# Patient Record
Sex: Female | Born: 1950
Health system: Southern US, Community
[De-identification: ages and names within clinical notes are randomized; demographics above are authoritative.]

## PROBLEM LIST (undated history)

## (undated) DIAGNOSIS — I739 Peripheral vascular disease, unspecified: Secondary | ICD-10-CM

## (undated) DIAGNOSIS — I6529 Occlusion and stenosis of unspecified carotid artery: Secondary | ICD-10-CM

## (undated) DIAGNOSIS — E785 Hyperlipidemia, unspecified: Secondary | ICD-10-CM

## (undated) DIAGNOSIS — F411 Generalized anxiety disorder: Secondary | ICD-10-CM

## (undated) DIAGNOSIS — F17200 Nicotine dependence, unspecified, uncomplicated: Secondary | ICD-10-CM

## (undated) DIAGNOSIS — G35 Multiple sclerosis: Secondary | ICD-10-CM

## (undated) HISTORY — DX: Generalized anxiety disorder: F41.1

## (undated) HISTORY — DX: Multiple sclerosis: G35

## (undated) HISTORY — DX: Hyperlipidemia, unspecified: E78.5

## (undated) HISTORY — DX: Peripheral vascular disease, unspecified: I73.9

## (undated) HISTORY — PX: OTHER SURGICAL HISTORY: SHX169

## (undated) HISTORY — DX: Occlusion and stenosis of unspecified carotid artery: I65.29

## (undated) HISTORY — DX: Nicotine dependence, unspecified, uncomplicated: F17.200

---

## 1990-04-06 HISTORY — PX: TUBAL LIGATION: SHX77

## 1999-01-13 ENCOUNTER — Encounter: Admission: RE | Admit: 1999-01-13 | Discharge: 1999-01-13 | Payer: Self-pay | Admitting: *Deleted

## 1999-02-10 ENCOUNTER — Other Ambulatory Visit: Admission: RE | Admit: 1999-02-10 | Discharge: 1999-02-10 | Payer: Self-pay | Admitting: *Deleted

## 2000-02-20 ENCOUNTER — Encounter: Admission: RE | Admit: 2000-02-20 | Discharge: 2000-02-20 | Payer: Self-pay | Admitting: *Deleted

## 2000-02-20 ENCOUNTER — Encounter: Payer: Self-pay | Admitting: *Deleted

## 2000-03-09 ENCOUNTER — Other Ambulatory Visit: Admission: RE | Admit: 2000-03-09 | Discharge: 2000-03-09 | Payer: Self-pay | Admitting: *Deleted

## 2001-02-23 ENCOUNTER — Encounter: Payer: Self-pay | Admitting: *Deleted

## 2001-02-23 ENCOUNTER — Encounter: Admission: RE | Admit: 2001-02-23 | Discharge: 2001-02-23 | Payer: Self-pay | Admitting: *Deleted

## 2001-07-06 ENCOUNTER — Other Ambulatory Visit: Admission: RE | Admit: 2001-07-06 | Discharge: 2001-07-06 | Payer: Self-pay | Admitting: *Deleted

## 2002-10-30 ENCOUNTER — Emergency Department (HOSPITAL_COMMUNITY): Admission: EM | Admit: 2002-10-30 | Discharge: 2002-10-30 | Payer: Self-pay | Admitting: Emergency Medicine

## 2003-07-27 ENCOUNTER — Encounter: Admission: RE | Admit: 2003-07-27 | Discharge: 2003-07-27 | Payer: Self-pay | Admitting: *Deleted

## 2005-03-20 ENCOUNTER — Encounter: Admission: RE | Admit: 2005-03-20 | Discharge: 2005-03-20 | Payer: Self-pay | Admitting: *Deleted

## 2006-02-10 ENCOUNTER — Other Ambulatory Visit: Admission: RE | Admit: 2006-02-10 | Discharge: 2006-02-10 | Payer: Self-pay | Admitting: *Deleted

## 2007-09-10 ENCOUNTER — Telehealth (INDEPENDENT_AMBULATORY_CARE_PROVIDER_SITE_OTHER): Payer: Self-pay | Admitting: *Deleted

## 2007-09-10 ENCOUNTER — Emergency Department (HOSPITAL_COMMUNITY): Admission: EM | Admit: 2007-09-10 | Discharge: 2007-09-10 | Payer: Self-pay | Admitting: Emergency Medicine

## 2007-09-13 ENCOUNTER — Ambulatory Visit: Payer: Self-pay | Admitting: Internal Medicine

## 2007-09-13 DIAGNOSIS — R55 Syncope and collapse: Secondary | ICD-10-CM | POA: Insufficient documentation

## 2007-09-13 DIAGNOSIS — F411 Generalized anxiety disorder: Secondary | ICD-10-CM

## 2007-09-13 DIAGNOSIS — G35 Multiple sclerosis: Secondary | ICD-10-CM

## 2007-09-13 DIAGNOSIS — G35D Multiple sclerosis, unspecified: Secondary | ICD-10-CM | POA: Insufficient documentation

## 2007-09-17 ENCOUNTER — Encounter: Payer: Self-pay | Admitting: Internal Medicine

## 2007-09-22 ENCOUNTER — Encounter: Payer: Self-pay | Admitting: Internal Medicine

## 2007-09-30 ENCOUNTER — Ambulatory Visit: Payer: Self-pay

## 2007-09-30 ENCOUNTER — Encounter: Payer: Self-pay | Admitting: Internal Medicine

## 2007-10-03 ENCOUNTER — Encounter: Payer: Self-pay | Admitting: Internal Medicine

## 2007-10-03 DIAGNOSIS — I739 Peripheral vascular disease, unspecified: Secondary | ICD-10-CM

## 2007-10-10 ENCOUNTER — Ambulatory Visit: Payer: Self-pay | Admitting: Internal Medicine

## 2007-10-11 ENCOUNTER — Ambulatory Visit: Payer: Self-pay | Admitting: Internal Medicine

## 2007-10-17 ENCOUNTER — Ambulatory Visit: Payer: Self-pay | Admitting: Surgery

## 2007-11-14 ENCOUNTER — Encounter: Payer: Self-pay | Admitting: Internal Medicine

## 2007-11-14 ENCOUNTER — Ambulatory Visit: Payer: Self-pay | Admitting: Surgery

## 2007-11-29 ENCOUNTER — Encounter: Admission: RE | Admit: 2007-11-29 | Discharge: 2007-11-29 | Payer: Self-pay | Admitting: Gynecology

## 2007-12-20 ENCOUNTER — Ambulatory Visit: Payer: Self-pay | Admitting: Internal Medicine

## 2007-12-20 LAB — CONVERTED CEMR LAB
ALT: 16 units/L (ref 0–35)
AST: 15 units/L (ref 0–37)

## 2008-01-30 ENCOUNTER — Ambulatory Visit: Payer: Self-pay | Admitting: Internal Medicine

## 2008-02-14 ENCOUNTER — Other Ambulatory Visit: Admission: RE | Admit: 2008-02-14 | Discharge: 2008-02-14 | Payer: Self-pay | Admitting: Gynecology

## 2008-05-14 ENCOUNTER — Encounter: Payer: Self-pay | Admitting: Internal Medicine

## 2008-05-14 ENCOUNTER — Ambulatory Visit: Payer: Self-pay | Admitting: Surgery

## 2008-09-05 ENCOUNTER — Telehealth: Payer: Self-pay | Admitting: Internal Medicine

## 2008-09-07 ENCOUNTER — Telehealth: Payer: Self-pay | Admitting: Internal Medicine

## 2008-12-14 ENCOUNTER — Ambulatory Visit: Payer: Self-pay | Admitting: Internal Medicine

## 2008-12-20 DIAGNOSIS — E785 Hyperlipidemia, unspecified: Secondary | ICD-10-CM

## 2008-12-20 DIAGNOSIS — Z8669 Personal history of other diseases of the nervous system and sense organs: Secondary | ICD-10-CM | POA: Insufficient documentation

## 2008-12-20 DIAGNOSIS — I2510 Atherosclerotic heart disease of native coronary artery without angina pectoris: Secondary | ICD-10-CM | POA: Insufficient documentation

## 2008-12-20 DIAGNOSIS — I6529 Occlusion and stenosis of unspecified carotid artery: Secondary | ICD-10-CM

## 2008-12-21 ENCOUNTER — Ambulatory Visit: Payer: Self-pay | Admitting: Internal Medicine

## 2008-12-21 DIAGNOSIS — F17200 Nicotine dependence, unspecified, uncomplicated: Secondary | ICD-10-CM

## 2009-01-10 ENCOUNTER — Encounter: Admission: RE | Admit: 2009-01-10 | Discharge: 2009-01-10 | Payer: Self-pay | Admitting: Gynecology

## 2009-01-16 ENCOUNTER — Telehealth: Payer: Self-pay | Admitting: Internal Medicine

## 2009-07-09 IMAGING — CR DG CHEST 2V
2 series · 2 of 2 positions shown · non-contrast
Comparison: None

CLINICAL DATA: Syncopal episode, smoking history, the patient has
multiple sclerosis

CHEST - 2 VIEW

[w chest pa]
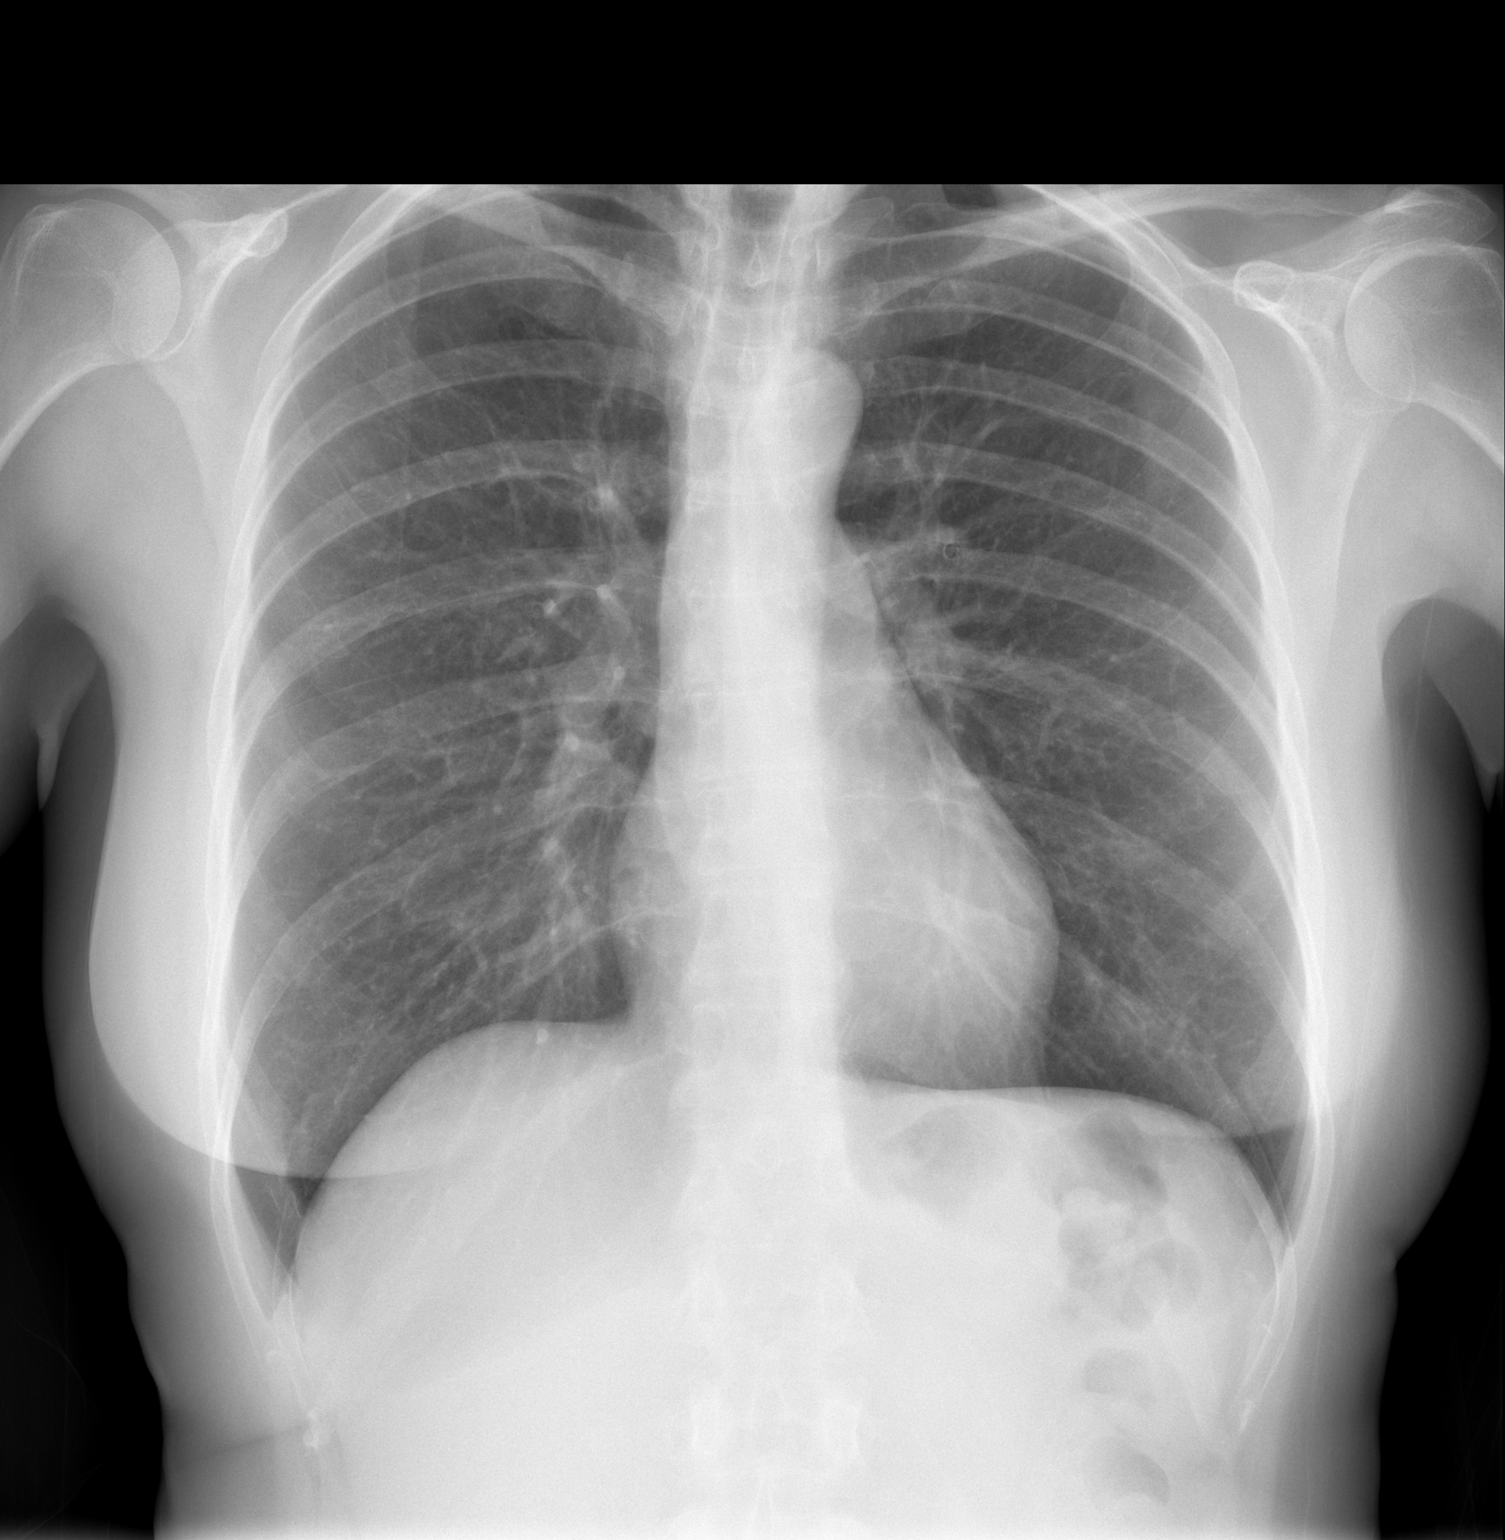

[w chest lat]
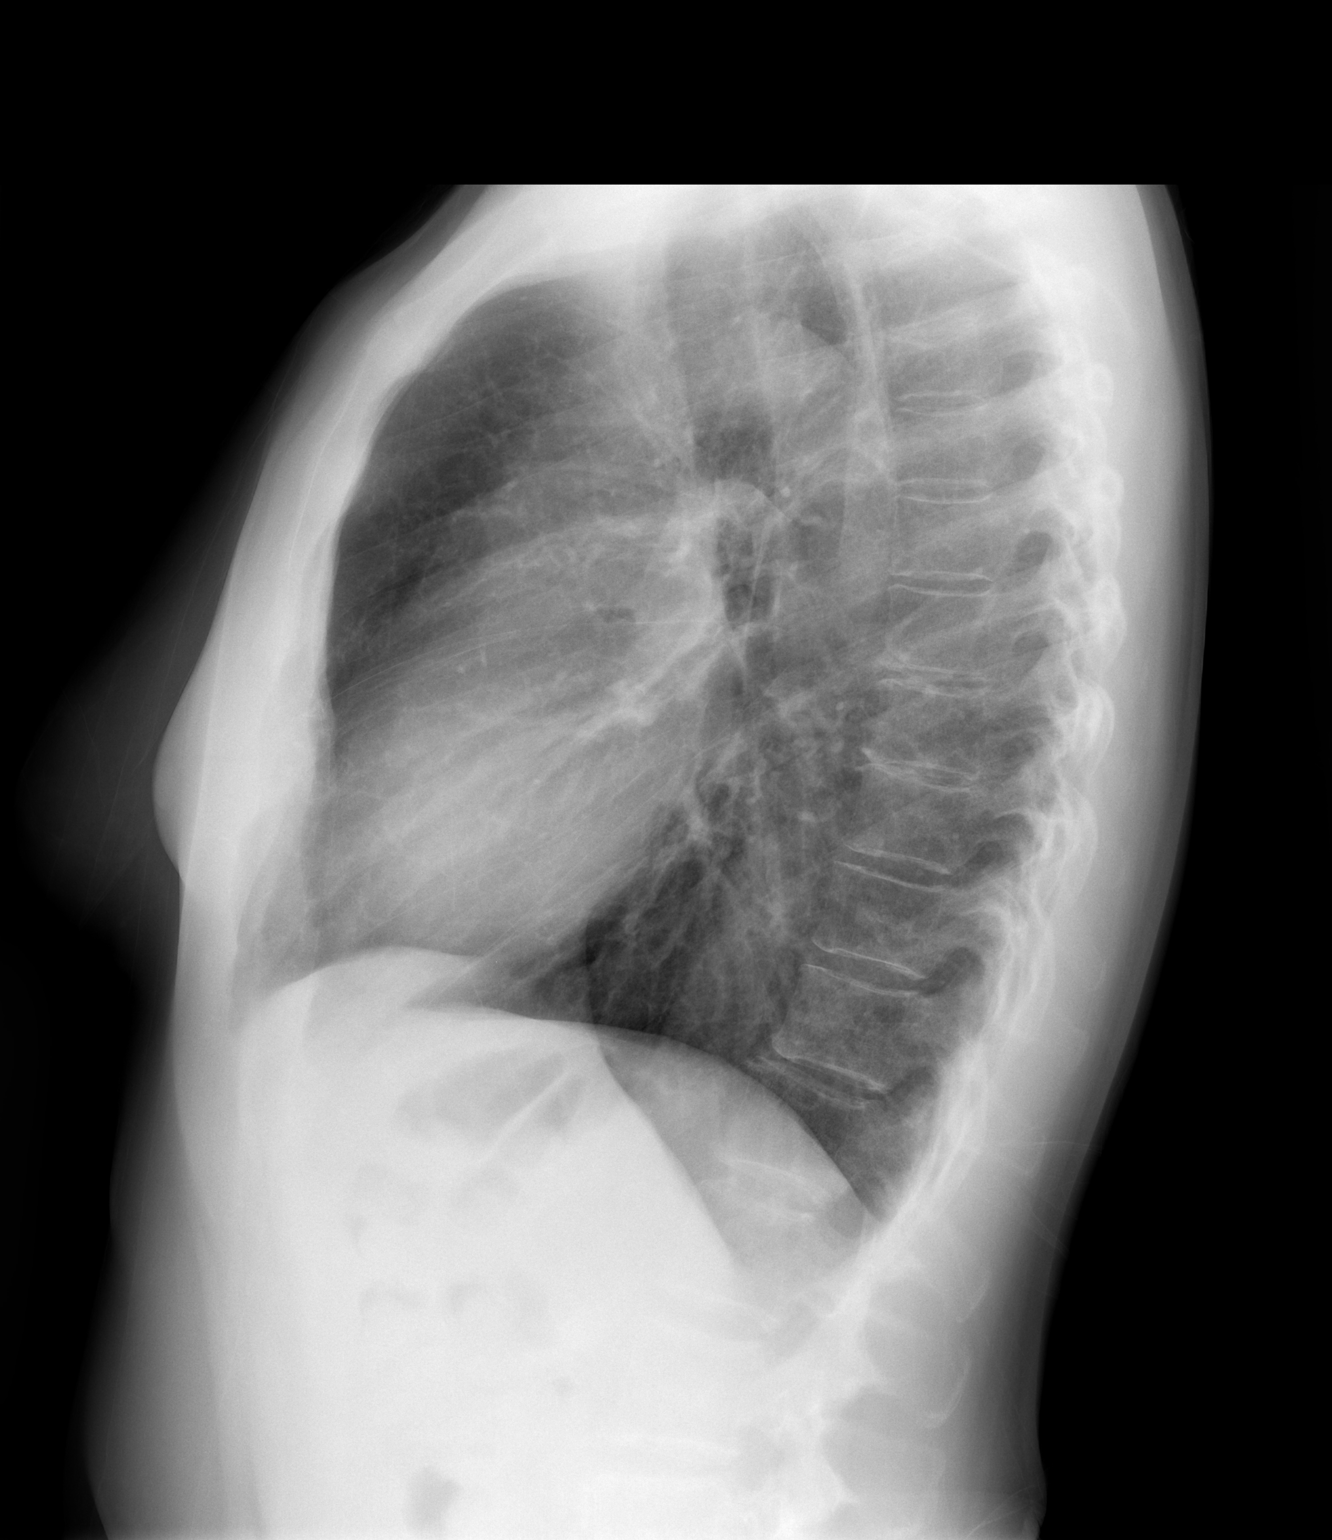

[2 of 2 positions shown; findings below may reference images not displayed]

FINDINGS: The lungs are clear and somewhat hyperaerated.  Heart is
within normal limits in size.  No bony abnormality is seen.
IMPRESSION: No active lung disease.  The lungs are hyperaerated.

## 2009-07-09 IMAGING — CT CT HEAD W/O CM
1 series · 16 of 30 positions shown, 20 images · non-contrast
Comparison: None

CLINICAL DATA: Multiple syncopal episodes of last few weeks

CT HEAD WITHOUT CONTRAST
TECHNIQUE: Contiguous axial images were obtained from the base of
the skull through the vertex without contrast.

[Series 2: head routine 4.8 h37s · axial · 0.43mm/px · z∈[+1203,+1332]mm · 16 of 30 slices shown, 20 images]
[im 2/30  brain]
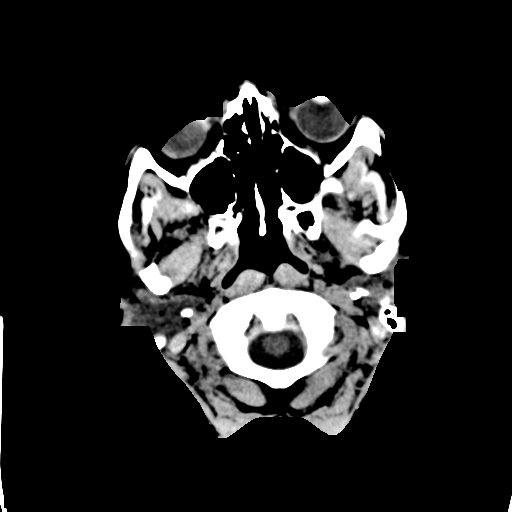
[im 2/30  bone]
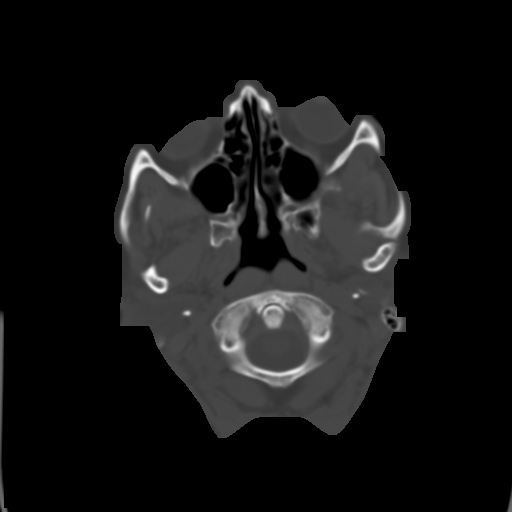
[im 4/30  brain]
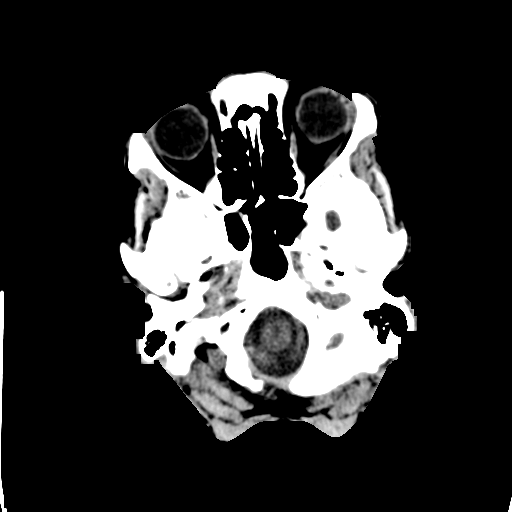
[im 6/30  brain]
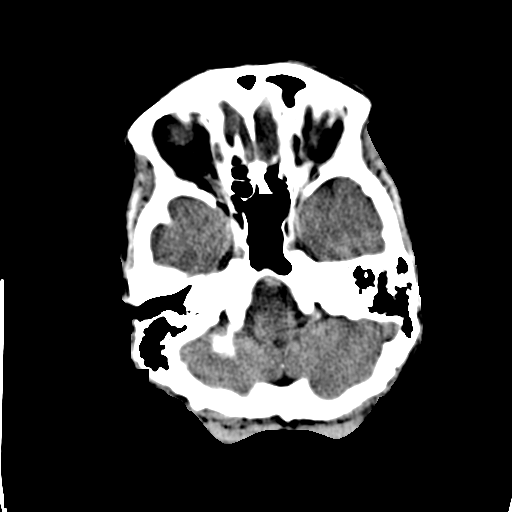
[im 8/30  brain]
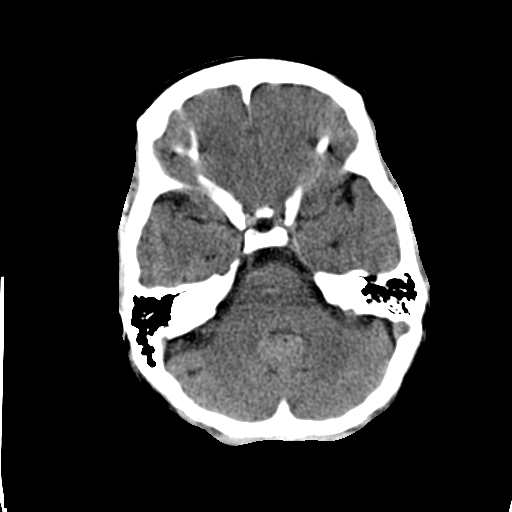
[im 9/30  brain]
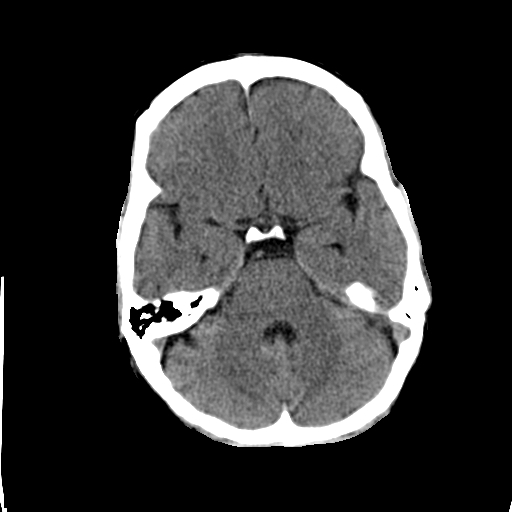
[im 9/30  bone]
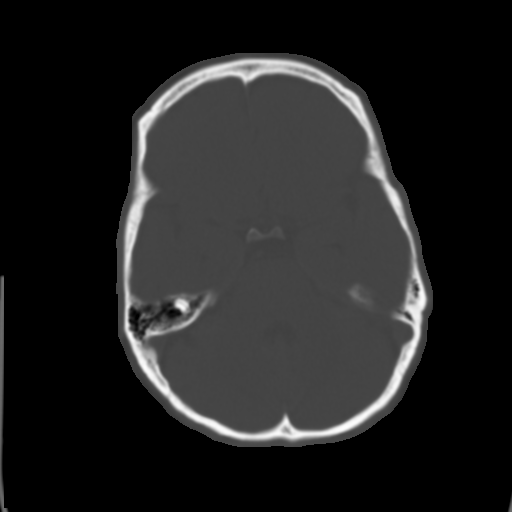
[im 11/30  brain]
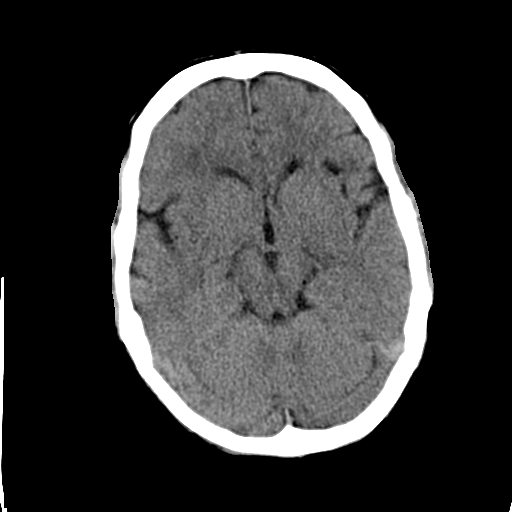
[im 13/30  brain]
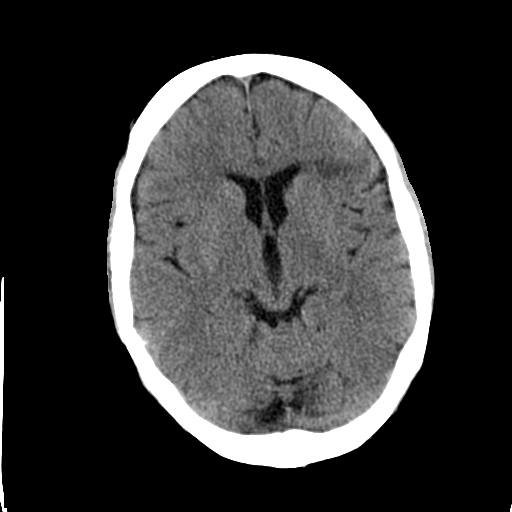
[im 15/30  brain]
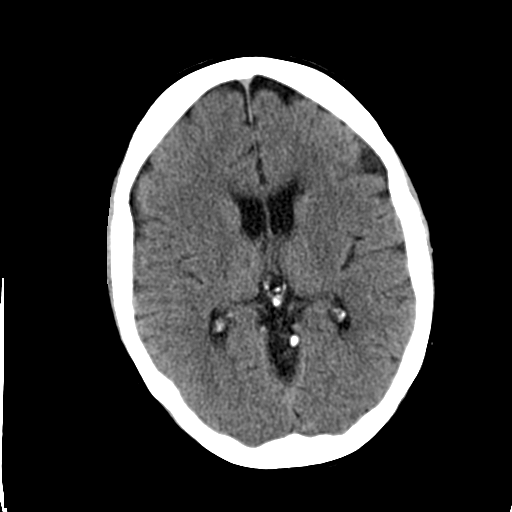
[im 16/30  brain]
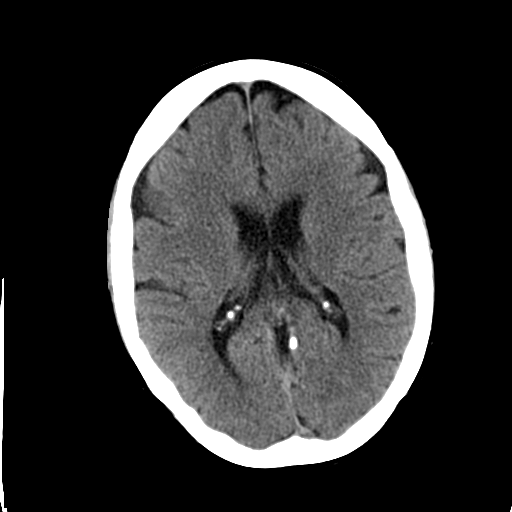
[im 16/30  bone]
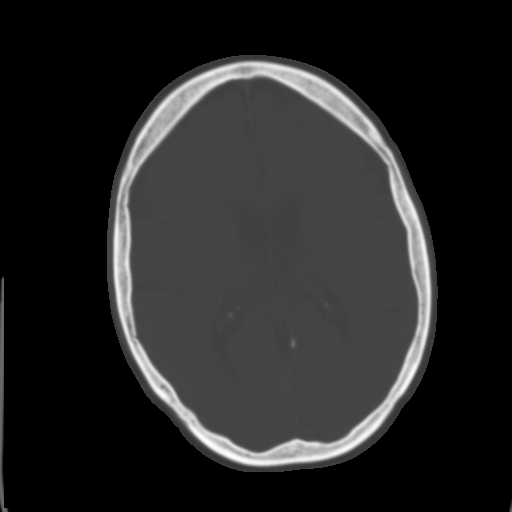
[im 18/30  brain]
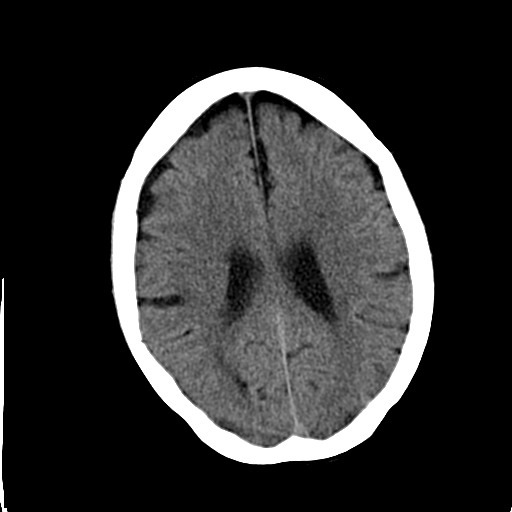
[im 20/30  brain]
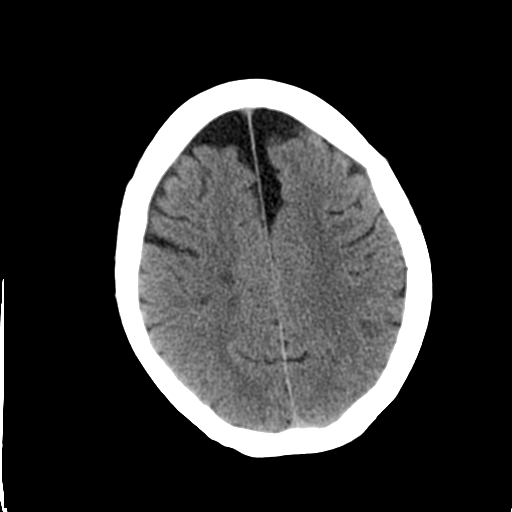
[im 22/30  brain]
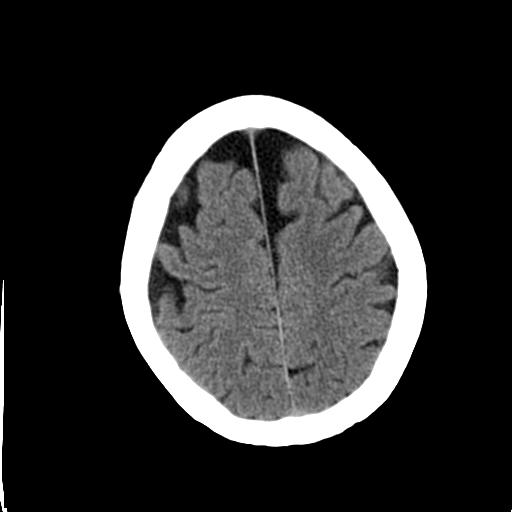
[im 23/30  brain]
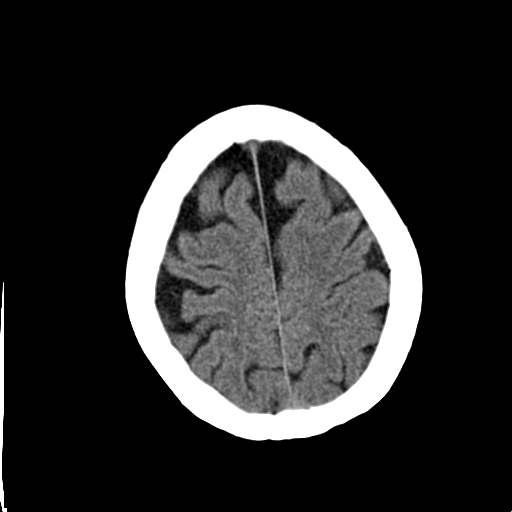
[im 23/30  bone]
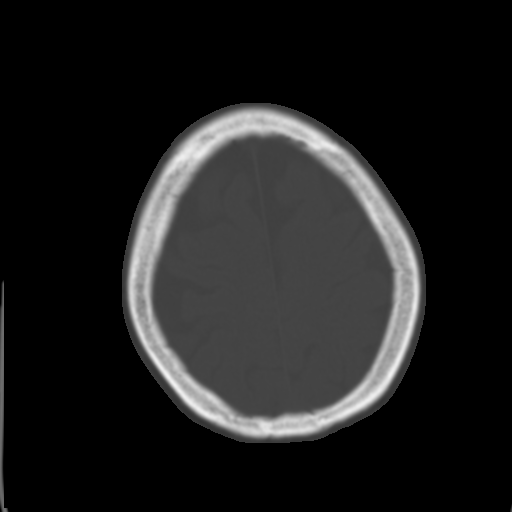
[im 25/30  brain]
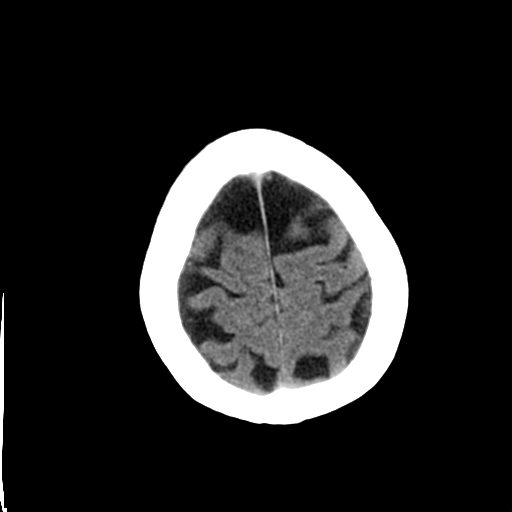
[im 27/30  brain]
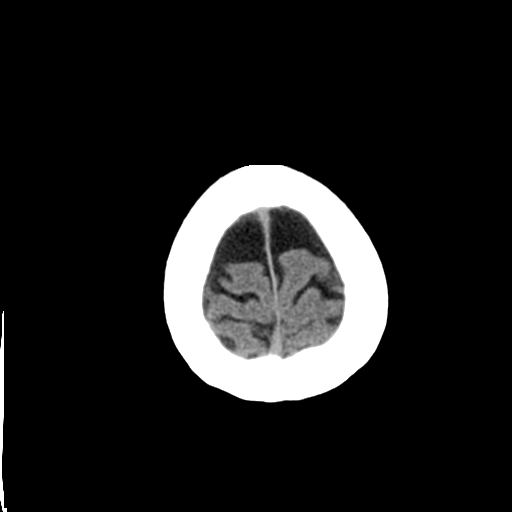
[im 29/30  brain]
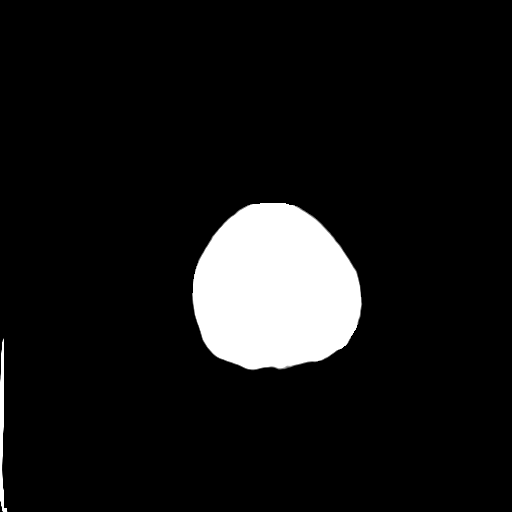

[16 of 30 positions shown; findings below may reference images not displayed]

FINDINGS: The ventricular system is normal in size and
configuration for age and the septum is in a normal midline
position.  Mild small vessel ischemic change is noted.  No blood,
edema, or mass effect is seen.  Somewhat prominent bifrontal CSF
spaces are noted extending into hemispherically . The sinuses are
clear and no bony abnormality is seen.
IMPRESSION: Mild small vessel ischemic change.  No acute intracranial
abnormality.

## 2009-08-19 ENCOUNTER — Encounter: Payer: Self-pay | Admitting: Internal Medicine

## 2009-08-19 ENCOUNTER — Ambulatory Visit: Payer: Self-pay | Admitting: Surgery

## 2009-09-03 ENCOUNTER — Encounter: Payer: Self-pay | Admitting: Internal Medicine

## 2009-12-24 ENCOUNTER — Telehealth: Payer: Self-pay | Admitting: Internal Medicine

## 2010-01-22 ENCOUNTER — Telehealth: Payer: Self-pay | Admitting: Internal Medicine

## 2010-01-22 ENCOUNTER — Encounter: Admission: RE | Admit: 2010-01-22 | Discharge: 2010-01-22 | Payer: Self-pay | Admitting: Gynecology

## 2010-03-20 ENCOUNTER — Telehealth: Payer: Self-pay | Admitting: Internal Medicine

## 2010-04-15 ENCOUNTER — Other Ambulatory Visit: Payer: Self-pay | Admitting: Internal Medicine

## 2010-04-15 ENCOUNTER — Ambulatory Visit
Admission: RE | Admit: 2010-04-15 | Discharge: 2010-04-15 | Payer: Self-pay | Source: Home / Self Care | Attending: Internal Medicine | Admitting: Internal Medicine

## 2010-04-15 LAB — HEPATIC FUNCTION PANEL
ALT: 15 U/L (ref 0–35)
AST: 14 U/L (ref 0–37)
Albumin: 3.2 g/dL — ABNORMAL LOW (ref 3.5–5.2)
Alkaline Phosphatase: 47 U/L (ref 39–117)
Bilirubin, Direct: 0.1 mg/dL (ref 0.0–0.3)
Total Bilirubin: 0.5 mg/dL (ref 0.3–1.2)
Total Protein: 5.8 g/dL — ABNORMAL LOW (ref 6.0–8.3)

## 2010-04-15 LAB — LIPID PANEL
Cholesterol: 142 mg/dL (ref 0–200)
HDL: 60.8 mg/dL (ref >39.00)
LDL Cholesterol: 72 mg/dL (ref 0–99)
Total CHOL/HDL Ratio: 2
Triglycerides: 48 mg/dL (ref 0.0–149.0)
VLDL: 9.6 mg/dL (ref 0.0–40.0)

## 2010-04-21 ENCOUNTER — Encounter: Payer: Self-pay | Admitting: Internal Medicine

## 2010-04-21 ENCOUNTER — Ambulatory Visit
Admission: RE | Admit: 2010-04-21 | Discharge: 2010-04-21 | Payer: Self-pay | Source: Home / Self Care | Attending: Internal Medicine | Admitting: Internal Medicine

## 2010-04-28 ENCOUNTER — Encounter: Payer: Self-pay | Admitting: Gynecology

## 2010-05-01 ENCOUNTER — Telehealth: Payer: Self-pay | Admitting: Internal Medicine

## 2010-05-06 NOTE — Progress Notes (Signed)
Summary: question re labwork  Phone Note Call from Patient   Caller: Patient [phone] Reason for Call: Talk to Nurse Summary of Call: pt has appt with  dr Tenny Craw on 10-27, has questions re labwork-pls call Initial call taken by: Glynda Jaeger,  December 24, 2009 11:03 AM  Follow-up for Phone Call        Called patient back...she wanted all her lab work done on the same day on 10/24. She has labs scheduled with Dr.John. I added LIPOMED for the same day and took out regular Lipid panel per Dr.Ross request. Moved her Dr.Ross appointment to 11/14 so that there would be enough time to get back Lipomed for her MD appointment. Layne Benton, RN, BSN  December 24, 2009 5:41 PM

## 2010-05-06 NOTE — Progress Notes (Signed)
Summary: question regarding lab work   Phone Note Call from Patient Call back at [employer] [phone] Call back at Work Phone [phone]   Caller: Patient Reason for Call: Talk to Nurse, Lab or Test Results Summary of Call: per pt calling, pt was not aware of her appt on 11/14 with dr. Tenny Craw, pt explain that she had appt on 10/24. explain to pt she called on 9/20 regarding blood work . read message from [place] when she called pt back on 9/20 in p.m. pt advise me that she will cancel her lab work / dr. Jonny Ruiz appt. but wants dr. Tenny Craw to set up her lab work. pt didn't understand why we couldn't put lab order into the system, i explain to pt that lab order/ any testing has to come from md/ nurse. pt would like for jackie to call her back .  Initial call taken by: Lorne Skeens,  January 22, 2010 8:40 AM  Follow-up for Phone Call        Pt wants labs and appts cancelled now! Done. Mylo Red RN

## 2010-05-08 NOTE — Progress Notes (Signed)
Summary: SWITCH PCP  ---- Converted from flag ---- ---- 04/24/2010 3:43 PM, [name] MD wrote: ok  ---- 04/22/2010 4:57 PM, Hilarie Fredrickson wrote: Alison Dominguez wants to switch from Dr. Jonny Ruiz to Dr. Felicity Coyer.  She has cx'd several appt's.  Will this be ok? ------------------------------       Additional Follow-up for Phone Call Additional follow up Details #2::    Cardiology has been notified.  LMOM x 2 FOR PT TO CALL FOR AN APPT. Follow-up by: Hilarie Fredrickson,  May 01, 2010 10:58 AM

## 2010-05-08 NOTE — Progress Notes (Signed)
Summary: pt needs a call back re blood work  Phone Note Call from Patient Call back at [employer] [phone]   Caller: Patient Reason for Call: Talk to Nurse Summary of Call: pt states she eants to talk to a nurse re getting labs when she comes to see the doctor. pt states she has not heard from anyone since this morning and would like to talk to a nurse.  Follow-up for Phone Call        Alison Dominguez will come in for fasting lipid/liver on 04/14/10.  She will call back if her simvastatin runs out before her 04/21/10 appt with Dr. Tenny Craw.  She uses [facility] on [address]. Mylo Red RN

## 2010-05-08 NOTE — Progress Notes (Signed)
Summary: switch pcp  ---- Converted from flag ---- ---- 05/01/2010 10:53 AM, Hilarie Fredrickson wrote: Cardiology has been notified.  LMOM x 2 for pt to call to schedule  ---- 04/24/2010 1:54 PM, Corwin Levins MD wrote: ok with me  ---- 04/22/2010 4:57 PM, Hilarie Fredrickson wrote: Alison Dominguez wants to switch from Dr. Jonny Dominguez to Dr. Felicity Dominguez.  She has cx'd several appt's.  Will this be ok ------------------------------

## 2010-05-08 NOTE — Assessment & Plan Note (Addendum)
Summary: f1y per pt call/lg   Visit Type:  Follow-up Primary Provider:  Corwin Levins MD   History of Present Illness: Alison Dominguez is a 60 year old with a hx of CV disease, syncope, dyslipidemia. She was last seen in September 2010 Since seen she has done well from a cardiac standpoint.  She denies chest pains.  Breathng is stable.  She continues to smoke and is not quite ready to quit. Followed by Dr. Jenne Pane for CV disease.  Does not see Dr. Jonny Ruiz anymore .  Would prefer to see a woman.    Current Medications (verified): 1)  Advil 200 Mg  Tabs (Ibuprofen) 2)  Tizanidine Hcl 2 Mg  Tabs (Tizanidine Hcl) .... 1/2 By Mouth Once Daily Prn 3)  Simvastatin 40 Mg Tabs (Simvastatin) .... Take One Tablet By Mouth Daily At Bedtime 4)  Aspirin 81 Mg Tbec (Aspirin) .... Take One Tablet By Mouth Daily 5)  Paxil 20 Mg Tabs (Paroxetine Hcl) .... As Needed 6)  Avonex 30 Mcg/vial Kit (Interferon Beta-1a) .Marland Kitchen.. 1 Tab Q Weekly  Allergies (verified): No Known Drug Allergies  Past History:  Past medical, surgical, family and social histories (including risk factors) reviewed, and no changes noted (except as noted below).  Past Medical History: Reviewed history from 12/20/2008 and no changes required. Current Problems:  CAROTID ARTERY STENOSIS, LEFT (ICD-433.10) DM (ICD-250.00) SYNCOPE, HX OF (ICD-V12.49) DYSLIPIDEMIA (ICD-272.4) CARDIOVASCULAR DISEASE (ICD-429.2) PVD (ICD-443.9) SYNCOPE (ICD-780.2) ANXIETY (ICD-300.00) MULTIPLE SCLEROSIS (ICD-340)  Past Surgical History: Reviewed history from 12/20/2008 and no changes required. Current Problems:  * LEFT FOOT SURGERY TUBAL LIGATION, HX OF (ICD-V26.51) 1992  Family History: Reviewed history from 09/13/2007 and no changes required. DM  Social History: Reviewed history from 09/13/2007 and no changes required. Single no children work - Engineer, [employer] Current Smoker Alcohol use-no  Review of Systems       Reviewed.  Neg to the  above problem except as noted above.  Vital Signs:  Patient profile:   60 year old female Height:      66 inches Weight:      156 pounds BMI:     25.27 Pulse rate:   81 / minute BP sitting:   110 / 76  (left arm) Cuff size:   regular  Vitals Entered By: Hardin Negus, RMA (April 21, 2010 3:10 PM)  Physical Exam  Additional Exam:  Patinet is in NAD HEENT:  Normocephalic, atraumatic. EOMI, PERRLA.  Neck: JVP is normal. No thyromegaly. No bruits.  Lungs: clear to auscultation. No rales no wheezes.  Heart: Regular rate and rhythm. Normal S1, S2. No S3.   No significant murmurs. PMI not displaced.  Abdomen:  Supple, nontender. Normal bowel sounds. No masses. No hepatomegaly.  Extremities:   Good distal pulses throughout. No lower extremity edema.  Musculoskeletal :moving all extremities.  Neuro:   alert and oriented x3.    EKG  Procedure date:  04/21/2010  Findings:      NSR.  81 bpm    Impression & Recommendations:  Problem # 1:  DYSLIPIDEMIA (ICD-272.4) Lipid panel is good on recent check.  LDL was 72, HDL was 60  Continue. Her updated medication list for this problem includes:    Simvastatin 40 Mg Tabs (Simvastatin) .Marland Kitchen... Take one tablet by mouth daily at bedtime  Problem # 2:  CAROTID ARTERY STENOSIS, LEFT (ICD-433.10) Will get records from [facility]. Her updated medication list for this problem includes:    Aspirin 81 Mg Tbec (Aspirin) .Marland KitchenMarland KitchenMarland KitchenMarland Kitchen  Take one tablet by mouth daily  Problem # 3:  TOBACCO ABUSE (ICD-305.1) Counselled again on quitting.  Patient afraid she will gain weight.  I told her it would be better than smoking.  Patient Instructions: 1)  Your physician wants you to follow-up in: 12 months with Dr.Kao Berkheimer  You will receive a reminder letter in the mail two months in advance. If you don't receive a letter, please call our office to schedule the follow-up appointment.   Appended Document: f1y per pt call/lg LM with Shaune Leeks at medical records at [facility]  to fax [place] records concerning carotid disease.  Appended Document: f1y per pt call/lg Records received [facility]. Will review with Dr.Jsiah Menta.

## 2010-05-09 NOTE — Medication Information (Signed)
Summary: RX Authorization Approval  RX Authorization Approval   Imported By: Roderic Ovens 09/17/2009 16:39:30  _____________________________________________________________________  External Attachment:    Type:   Image     Comment:   External Document

## 2010-05-16 ENCOUNTER — Telehealth: Payer: Self-pay | Admitting: Internal Medicine

## 2010-05-22 NOTE — Letter (Signed)
Summary: [facility] Office Visit Note   [facility] Office Visit Note   Imported By: Roderic Ovens 05/13/2010 12:33:29  _____________________________________________________________________  External Attachment:    Type:   Image     Comment:   External Document

## 2010-05-22 NOTE — Progress Notes (Addendum)
Summary: question re rx  Phone Note Call from Patient   Caller: Patient [phone] Reason for Call: Talk to Nurse Summary of Call: pt calling re an rx that was to be called in for her last vist-pharmacy does not have and she doesn't remember the name Initial call taken by: Glynda Jaeger,  May 16, 2010 9:02 AM  Follow-up for Phone Call        Called patient and she is requesting that we send a refill for Simvastatin to [facility].  Layne Benton, RN, BSN  May 16, 2010 10:39 AM      LM with patient to [facility] out [facility].   Layne Benton, RN, BSN  May 16, 2010 10:43 AM  Patient called back and advised to send script to [facility].  Layne Benton, RN, BSN  May 16, 2010 10:57 AM     Prescriptions: SIMVASTATIN 40 MG TABS (SIMVASTATIN) Take one tablet by mouth daily at bedtime  #90 x 3   Entered by:   Layne Benton, RN, BSN   Authorized by:   Sherrill Raring, MD, [facility]   Signed by:   Layne Benton, RN, BSN on 05/16/2010   Method used:   Faxed to ...       [facility] (mail-order)       [id number]       [place], [place]  [id number]       Ph:        Fax: [phone]   RxID:   [id number]

## 2010-05-22 NOTE — Letter (Signed)
Summary: [facility] Office Visit Note   [facility] Office Visit Note   Imported By: Roderic Ovens 05/13/2010 12:34:23  _____________________________________________________________________  External Attachment:    Type:   Image     Comment:   External Document

## 2010-06-25 ENCOUNTER — Encounter: Payer: Self-pay | Admitting: *Deleted

## 2010-07-15 ENCOUNTER — Other Ambulatory Visit (INDEPENDENT_AMBULATORY_CARE_PROVIDER_SITE_OTHER): Payer: Self-pay | Admitting: Internal Medicine

## 2010-07-15 ENCOUNTER — Other Ambulatory Visit (INDEPENDENT_AMBULATORY_CARE_PROVIDER_SITE_OTHER): Payer: Self-pay

## 2010-07-15 ENCOUNTER — Other Ambulatory Visit: Payer: Self-pay | Admitting: Internal Medicine

## 2010-07-15 DIAGNOSIS — Z0000 Encounter for general adult medical examination without abnormal findings: Secondary | ICD-10-CM

## 2010-07-15 LAB — CBC WITH DIFFERENTIAL/PLATELET
Basophils Absolute: 0.1 10*3/uL (ref 0.0–0.1)
Eosinophils Absolute: 0.2 10*3/uL (ref 0.0–0.7)
Eosinophils Relative: 2.4 % (ref 0.0–5.0)
Hemoglobin: 14.9 g/dL (ref 12.0–15.0)
Lymphocytes Relative: 21.7 % (ref 12.0–46.0)
Lymphs Abs: 1.5 10*3/uL (ref 0.7–4.0)
Neutro Abs: 4.9 10*3/uL (ref 1.4–7.7)
RDW: 13.7 % (ref 11.5–14.6)
WBC: 7 10*3/uL (ref 4.5–10.5)

## 2010-07-15 LAB — BASIC METABOLIC PANEL
BUN: 19 mg/dL (ref 6–23)
Calcium: 8.8 mg/dL (ref 8.4–10.5)
Glucose, Bld: 80 mg/dL (ref 70–99)
Sodium: 142 mEq/L (ref 135–145)

## 2010-07-15 LAB — LIPID PANEL
HDL: 62.9 mg/dL (ref >39.00)
LDL Cholesterol: 78 mg/dL (ref 0–99)
Total CHOL/HDL Ratio: 2
Triglycerides: 61 mg/dL (ref 0.0–149.0)

## 2010-07-15 LAB — URINALYSIS, ROUTINE W REFLEX MICROSCOPIC
Ketones, ur: NEGATIVE
Specific Gravity, Urine: 1.015 (ref 1.000–1.030)
Total Protein, Urine: NEGATIVE
Urine Glucose: NEGATIVE
Urobilinogen, UA: 0.2 (ref 0.0–1.0)
pH: 7 (ref 5.0–8.0)

## 2010-07-15 LAB — HEPATIC FUNCTION PANEL
Bilirubin, Direct: 0.1 mg/dL (ref 0.0–0.3)
Total Bilirubin: 0.8 mg/dL (ref 0.3–1.2)
Total Protein: 6 g/dL (ref 6.0–8.3)

## 2010-07-15 LAB — TSH: TSH: 1.45 u[IU]/mL (ref 0.35–5.50)

## 2010-07-22 ENCOUNTER — Ambulatory Visit: Payer: Self-pay | Admitting: Internal Medicine

## 2010-07-28 ENCOUNTER — Ambulatory Visit (INDEPENDENT_AMBULATORY_CARE_PROVIDER_SITE_OTHER): Payer: 59 | Admitting: Internal Medicine

## 2010-07-28 ENCOUNTER — Encounter: Payer: Self-pay | Admitting: Internal Medicine

## 2010-07-28 VITALS — BP 124/72 | HR 101 | Temp 98.6°F | Ht 69.0 in | Wt 157.8 lb

## 2010-07-28 DIAGNOSIS — Z2911 Encounter for prophylactic immunotherapy for respiratory syncytial virus (RSV): Secondary | ICD-10-CM

## 2010-07-28 DIAGNOSIS — F411 Generalized anxiety disorder: Secondary | ICD-10-CM

## 2010-07-28 DIAGNOSIS — Z0000 Encounter for general adult medical examination without abnormal findings: Secondary | ICD-10-CM

## 2010-07-28 DIAGNOSIS — F17200 Nicotine dependence, unspecified, uncomplicated: Secondary | ICD-10-CM

## 2010-07-28 DIAGNOSIS — I739 Peripheral vascular disease, unspecified: Secondary | ICD-10-CM

## 2010-07-28 DIAGNOSIS — G35 Multiple sclerosis: Secondary | ICD-10-CM

## 2010-07-28 DIAGNOSIS — Z23 Encounter for immunization: Secondary | ICD-10-CM

## 2010-07-28 DIAGNOSIS — E785 Hyperlipidemia, unspecified: Secondary | ICD-10-CM

## 2010-07-28 MED ORDER — VARENICLINE TARTRATE 0.5 MG X 11 & 1 MG X 42 PO MISC: ORAL | Status: AC

## 2010-07-28 MED ORDER — METAXALONE 800 MG PO TABS: 800.0000 mg | ORAL_TABLET | Freq: Three times a day (TID) | ORAL | Status: AC

## 2010-07-28 NOTE — Progress Notes (Signed)
Subjective:    Patient ID: Alison Dominguez, female    DOB: 09-21-1950, 60 y.o.   MRN: [id number]  HPI patient is here today for annual physical. Patient feels well and has no complaints.  Also reviewed chronic medical issues: Dyslipidemia - on statin since syncope event in 2010 - the patient reports compliance with medication(s) as prescribed. Denies adverse side effects. PVD - L ICA stenosis - 60% stable on serial doppler since 2010 - no HA, vision change or TIA symptoms - no recurrent syncope MS - currently off med tx - prev IFN shots, stopped due to $$ - no recent flares or exacerbations, chronic fatigue symptoms  Smoking - considering cessation - prev tried chantix, did well without adv side effects but did not maintain efforts  Past Medical History  Diagnosis Date  . TOBACCO ABUSE   . Multiple sclerosis   . CAROTID ARTERY STENOSIS, LEFT   . PVD   . SYNCOPE   . ANXIETY   . DYSLIPIDEMIA    Family History  Problem Relation Age of Onset  . Diabetes Other     Family hx   History  Substance Use Topics  . Smoking status: Current Everyday Smoker  . Smokeless tobacco: Not on file   Comment: Single, no children. Work in [employer]  . Alcohol Use: No    Review of Systems  Constitutional: Negative for fever.  Respiratory: Negative for cough and shortness of breath.   Cardiovascular: Negative for chest pain.  Gastrointestinal: Negative for abdominal pain.  Musculoskeletal: Negative for gait problem.  Skin: Negative for rash.  Neurological: Negative for dizziness.  No other specific complaints in a complete review of systems (except as listed in HPI above).     Objective:   Physical Exam BP 124/72  Pulse 101  Temp(Src) 98.6 F (37 C) (Oral)  Ht 5\' 9"  (1.753 m)  Wt 157 lb 12.8 oz (71.578 kg)  BMI 23.30 kg/m2  SpO2 95% Physical Exam  Constitutional: She is oriented to person, place, and time. She appears well-developed and well-nourished. No distress.  HENT:  Head: Normocephalic and atraumatic. Ears: normal B pinnae, TMs clear without effusion; Nose: Nose normal. Mouth/Throat: Oropharynx is clear and moist. No oropharyngeal exudate.  Eyes: Conjunctivae and EOM are normal. Pupils are equal, round, and reactive to light. No scleral icterus.  Neck: Normal range of motion. Neck supple. No JVD present. No thyromegaly present.  Cardiovascular: Normal rate, regular rhythm and normal heart sounds.  No murmur heard. Pulmonary/Chest: Effort normal and breath sounds normal. No respiratory distress. She has no wheezes.  Abdominal: Soft. Bowel sounds are normal. She exhibits no distension. There is no tenderness.  GU: defer to gynecology (mezer) Musculoskeletal: Normal range of motion. She exhibits no edema.  Neurological: She is alert and oriented to person, place, and time. No cranial nerve deficit. Coordination normal.  Skin: Skin is warm and dry. No rash noted. No erythema.  Psychiatric: She has a normal mood and affect. Her behavior is normal. Judgment and thought content normal.       Lab Results  Component Value Date   WBC 7.0 07/15/2010   HGB 14.9 07/15/2010   HCT 43.7 07/15/2010   PLT 218.0 07/15/2010   CHOL 153 07/15/2010   TRIG 61.0 07/15/2010   HDL 62.90 07/15/2010   ALT 13 07/15/2010   AST 13 07/15/2010   NA 142 07/15/2010   K 4.3 07/15/2010   CL 105 07/15/2010   CREATININE 0.8 07/15/2010  BUN 19 07/15/2010   CO2 31 07/15/2010   TSH 1.45 07/15/2010    Assessment & Plan:  CPX - Patient has been counseled on age-appropriate routine health concerns for screening and prevention. These are reviewed and up-to-date. Immunizations are up-to-date or declined. Labs and ECG reviewed (NSR, no ischemic change)

## 2010-07-28 NOTE — Patient Instructions (Addendum)
It was good to see you today. We have reviewed your prior records including labs and tests today Medications reviewed, no changes at this time. we'll make referral to GI for colonoscopy screening. Our office will contact you regarding appointment(s) once made. Zostavax and Tdap given today Follow with gynecology every 2-3 years for PAP/pelvic and mammogram Chantix to help you quit smoking as discussed - Your prescription(s) have been given to you to submit to your pharmacy. Please take as directed and contact our office if you believe you are having problem(s) with the medication(s). Good luck with quitting! Change zanaflex to skelakin for muscle cramps - Your prescription(s) have been submitted to your pharmacy. Please take as directed and contact our office if you believe you are having problem(s) with the medication(s). Please schedule followup in 6-12 months for cholesterol check, call sooner if problems.

## 2010-07-29 ENCOUNTER — Encounter: Payer: Self-pay | Admitting: Internal Medicine

## 2010-07-29 NOTE — Assessment & Plan Note (Signed)
Uses paxil "prn" - reviewed prescribed indication for daily use and encouraged compliance as prescribed 

## 2010-07-29 NOTE — Assessment & Plan Note (Signed)
On statin - The current medical regimen is effective;  continue present plan and medications. Known, stable ICA stenosis - encouraged smoking cessation

## 2010-07-29 NOTE — Assessment & Plan Note (Signed)
5 minutes today spent counseling patient on unhealthy effects of continued tobacco abuse and encouragement of cessation including medical options available to help the patient quit smoking. Prescription provided for chantix today to fill when ready to quit

## 2010-07-29 NOTE — Assessment & Plan Note (Signed)
Prev rx'd Interferon beta - stopped same due to cost Awaiting establishment with new neuro provider - reports symptoms stable without recent flare

## 2010-08-12 ENCOUNTER — Other Ambulatory Visit: Payer: Self-pay

## 2010-08-19 NOTE — Assessment & Plan Note (Signed)
OFFICE VISIT   Alison Dominguez, Alison Dominguez  DOB:  Mar 23, 1951                                       05/14/2008  WGNFA#:[id number]   HISTORY:  This is a 60 year old female that I saw in August of 2009 for  evaluation of carotid disease in the setting of two syncopal episodes.  She has a history of MS.  As part of her syncopal workup carotid duplex  revealed 60-79% left carotid stenosis and minimal right-sided carotid  disease.  I did not feel at that time that her symptoms were due to her  carotid disease and elected to repeat her ultrasound today.  In the  interval since I last saw her she has not had any other symptoms of  syncope.  She has been doing fine.   PHYSICAL EXAMINATION:  Vital signs:  Her blood pressure is 113/76 and  her pulse is 71.  General:  She is well-appearing and in no distress.  Cardiovascular:  Regular rate and rhythm.  Neurological:  She is  nonfocal.   DIAGNOSTIC STUDIES:  Carotid duplex was performed today which revealed  20-39% right internal carotid stenosis and 40-59% left common carotid  stenosis.  These were deemed to be no significant change from her  previous study.   ASSESSMENT:  History of syncope and asymptomatic carotid stenosis on the  left.   PLAN:  Again, I do not feel that the patient's symptoms were related to  her carotid disease.  She does have moderate stenosis on the left and  therefore this is something that we should follow up.  Should she  progress to greater than 80% without symptoms we would recommend an  operation to correct her stenosis.  I plan on following her up in 1 year  with a repeat ultrasound.   Jorge Ny, MD  Electronically Signed   VWB/MEDQ  D:  05/14/2008  T:  05/16/2008  Job:  [id number]   cc:   Pricilla Riffle, MD, [facility]
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
## 2010-08-19 NOTE — Procedures (Signed)
CAROTID DUPLEX EXAM   INDICATION:  Follow up carotid disease.   HISTORY:  Diabetes:  No.  Cardiac:  No.  Hypertension:  No.  Smoking:  Yes.  Previous Surgery:  No.  CV History:  Syncopal episode in 2009, currently asymptomatic.  Amaurosis Fugax No, Paresthesias No, Hemiparesis No.                                       RIGHT             LEFT  Brachial systolic pressure:         118               116  Brachial Doppler waveforms:         Normal            Normal  Vertebral direction of flow:        Antegrade         Antegrade  DUPLEX VELOCITIES (cm/sec)  CCA peak systolic                   64                76  ECA peak systolic                   67                73  ICA peak systolic                   76                128  ICA end diastolic                   28                41  PLAQUE MORPHOLOGY:                  Heterogenous      Heterogenous  PLAQUE AMOUNT:                      Mild              Mild/moderate  PLAQUE LOCATION:                    ICA               ICA   IMPRESSION:  1. 1% to 39% stenosis of the right internal carotid artery.  2. 40% to 59% stenosis of the left internal carotid artery.  There is      a nonmobile fingerlike projection of plaque noted in the bilateral      proximal internal carotid arteries, which appears to have a      possible intimal flap component.  Unable to determine if this is a      remnant of a previous dissection.  3. No significant change noted when compared to the previous      examination on 05/14/2008.   ___________________________________________  V. Charlena Cross, MD   CH/MEDQ  D:  08/19/2009  T:  08/19/2009  Job:  [id number]

## 2010-08-19 NOTE — Procedures (Signed)
CAROTID DUPLEX EXAM   INDICATION:  Follow-up evaluation of known carotid artery disease.   HISTORY:  Diabetes:  No.  Cardiac:  No.  Hypertension:  No.  Smoking:  Yes.  Previous Surgery:  No.  CV History:  Patient had an episode of syncope last year.  Previous  duplex at [facility] on September 30, 2007, revealed 0% to 39% right  ICA stenosis and 60% to 79% left ICA stenosis with a suggestion of  bilateral carotid dissection.  Amaurosis Fugax No, Paresthesias No, Hemiparesis No.                                       RIGHT             LEFT  Brachial systolic pressure:         116               118  Brachial Doppler waveforms:         Triphasic         Triphasic  Vertebral direction of flow:        Antegrade         Antegrade  DUPLEX VELOCITIES (cm/sec)  CCA peak systolic                   92                110  ECA peak systolic                   107               111  ICA peak systolic                   86                156  ICA end diastolic                   33                68  PLAQUE MORPHOLOGY:                  Soft              Soft  PLAQUE AMOUNT:                      Mild              Mild to moderate  PLAQUE LOCATION:                    ICA origin        Proximal ICA   IMPRESSION:  1. A 20% to 39% right ICA stenosis (gray scale images in proximal      right internal carotid artery are consistent with dissection).  2. A 40% to 59% left ICA stenosis (high end of range).  3. No significant change from previous study.   ___________________________________________  V. Charlena Cross, MD   MC/MEDQ  D:  05/14/2008  T:  05/14/2008  Job:  [id number]

## 2010-08-19 NOTE — Assessment & Plan Note (Signed)
[facility]                            CARDIOLOGY OFFICE NOTE   Alison Dominguez, Alison Dominguez                        MRN:          [id number]  DATE:10/10/2007                            DOB:          [birth year]    IDENTIFICATION:  Alison Dominguez is a 60 year old woman who was referred by  Dr. Oliver Barre for evaluation of syncope.   HISTORY OF PRESENT ILLNESS:  The patient had 2 episodes of syncope over  the past 3 weeks.  She describes the spells actually as precipitated by  a funny feeling in the left side of her head, really no prodrome, no  palpitations.  She quickly passed out.  One episode occurred while  driving.  Again, there is some question of full syncope.  Of note, she  was seen in the [facility] Emergency Room.  A CT was done, which was  reportedly negative.   The patient has also been seen by Dr. Marcelino Freestone.  She does have  a history of multiple sclerosis.  She had an MRA done and has gone on to  have carotid Dopplers.  I do have the carotid studies that showed,  indeed, narrowing of the ostia of the ICAs bilaterally.  There was a  question of some ulcerative plaque, mobile thrombus, pseudointimal  hyperplasia, or focal intimal dissection, did not appear to represent  fibromuscular dysplasia.  On the left, ICA with 60-79% stenosis, right  ICA with 0-39% stenosis.  Note, the patient now has followup plan with  one of the vascular surgeons.  This appointment is for next week.   On talking to the patient, she has had several episodes of dizziness,  no frank syncope since.  She denies headaches at present.  She denies  significant palpitations.   ALLERGIES:  None.   CURRENT MEDICINES:  1. Fish oil daily.  2. Aspirin 81 mg.   PAST MEDICAL HISTORY:  1. Multiple sclerosis diagnosed in 1999, seen at [facility].  2. Anxiety.   FAMILY HISTORY:  Negative for coronary artery disease by her report, is  positive for diabetes.   SOCIAL  HISTORY:  The patient is a current smoker, 1 pack per day since  she was a teenager.  Does not drink.  Works in [employer].   REVIEW OF SYSTEMS:  All systems reviewed, negative to the above problems  except as noted above.   PHYSICAL EXAMINATION:  GENERAL:  The patient is in no acute distress at  rest.  VITAL SIGNS:  Blood pressure 129/77 and pulse is 80 and regular.  Orthostatics; lying 125/76, pulse 74; sitting 122/80, pulse 78; standing  at 0 minutes 127/81, pulse 90; at 2 minutes 121/71, pulse 89; at 5  minutes 117/79, pulse 88, the patient asymptomatic throughout.  HEENT:  Normocephalic and atraumatic.  EOMI, PERRL.  Mucous membranes  moist.  NECK:  JVP is normal.  No thyromegaly.  Question bruit, left.  LUNGS:  Clear to auscultation without rales or wheezes.  CARDIAC:  Regular rate and rhythm.  S1 and S2.  No S3.  No significant  murmurs.  ABDOMEN:  Benign.  EXTREMITIES:  Distal pulses 2+ throughout.  No lower extremity edema.   A 12-lead EKG shows normal sinus rhythm, 71 beats per minute.  Normal  conduction intervals (done on September 10, 2007, at [facility]).   Labs done on September 10, 2007, normal hemoglobin at 15, BUN and creatinine  of 11 and less than 0.2, and potassium of 3.7.  Chest x-ray shows  hyperaeration, otherwise negative.   MRA (September 29, 2007), borderline MRA, question tight band stenosis in the  origins of the internal carotids.  Recommend carotid Dopplers.   IMPRESSION:  Alison Dominguez is a 60 year old now with 2 episodes of syncope  and dizziness.  Worrisome are her carotid Doppler findings.  This may  indeed explain her symptoms, if she had had an embolic event.   I agree with Dr. Bethann Goo referral to the vascular surgeons.   I would not recommend any further testing for now other than a fasting  lipid panel.  The patient needs to be on a statin.  I have given a  prescription for Zocor.  We will get the lipid panel in the a.m.  She  should  refrain from smoking and again, I have discussed at length with  her the risks and the needs to cut back and quit.   With her recent events, she should not drive.  If she does, she is at  her own risk.  Again, this is up to 6 months from identification of a  treatable cause and no further symptoms.   I will set to see the patient back in a few weeks.  Again, she has an  appointment next week with vascular surgeons.     Pricilla Riffle, MD, [facility]  Electronically Signed    PVR/MedQ  DD: 10/11/2007  DT: 10/11/2007  Job #: [id number]   cc:   Corwin Levins, MD

## 2010-08-19 NOTE — Assessment & Plan Note (Signed)
Alison Dominguez                            CARDIOLOGY OFFICE NOTE   [name], Dominguez                        MRN:          [id number]  DATE:01/30/2008                            DOB:          [birth year]    IDENTIFICATION:  The patient is a 60 year old woman who I have seen in  the past.  She has a history of cerebrovascular disease.  I saw her  actually in June 2009.  In July 2009, she had episodes of syncope.  She  had an evaluation that included a carotid ultrasound that showed a 60-  79% left ICA stenoses.   Since I have seen her, she has been referred to vascular surgery Dr.  Myra Dominguez and who will plan on following her.   Otherwise, she denies chest pain.  Breathing is okay.  She stopped the  Zocor because she said it was leading to constipation.   CURRENT MEDICATION:  Aspirin 81.   PHYSICAL EXAMINATION:  GENERAL:  The patient is in no distress.  VITAL SIGNS:  Blood pressure 130/86, pulse is 84 and regular, weight  143.  LUNGS:  Clear.  NECK:  No bruits audible.  CARDIAC:  Regular rate and rhythm.  S1 and S2.  No S3.  No significant  murmurs.  ABDOMEN:  Benign.  EXTREMITIES:  No edema.  2+ pulses.   IMPRESSION:  1. Cardiovascular disease.  Note, following now in vascular surgery.      Continue on medical therapy.  2. Syncope.  No recurrence.  Note, the patient has reported history of      multiple sclerosis following with Dr. Mcarthur Dominguez.  3. Dyslipidemia.  The patient had excellent control of her lipids on      Zocor, said it was leading to constipation.  I gave her some names      of stool softeners and told her try it again, if she has problems      to call back.  She needs a statin.  4. Dominguez maintenance.  Counseled her regarding tobacco cessation.      She will reflect on.   I will set followup for 10 months.  At that time, if she is back on a  good regimen, I may dismiss her from Cardiac Clinic unless her symptoms  change as she  is followed also by other subspecialists.     Alison Riffle, MD, [facility]  Electronically Signed    PVR/MedQ  DD: 01/30/2008  DT: 01/31/2008  Job #: [id number]   cc:   Alison Ny, MD  Alison Dominguez, M.D.

## 2010-08-19 NOTE — Assessment & Plan Note (Signed)
OFFICE VISIT   Alison Dominguez, Alison Dominguez  DOB:  11-06-1950                                       10/17/2007  ZOXWR#:[id number]   The patient comes in to me for evaluation of carotid stenosis.  She is a  very complicated patient with a history of MS.  I am having trouble  getting all of her records sent to me.  Also, the referring physician,  Dr. Orlin Hilding, her neurologist is out of town, and I would like to speak  with her.  I would also like to review her recent MRI as well as carotid  duplex imaging.  I have recommended the patient wait.  However, she has  been here for quite some time and would like to reschedule.  She will be  scheduled to see me in 2 weeks as a new patient.  There will be no  charge for today's visit.   Jorge Ny, MD  Electronically Signed   VWB/MEDQ  D:  10/17/2007  T:  10/18/2007  Job:  [id number]

## 2010-10-01 ENCOUNTER — Encounter: Payer: Self-pay | Admitting: Gastroenterology

## 2011-01-01 LAB — DIFFERENTIAL
Basophils Relative: 1
Eosinophils Absolute: 0.2
Eosinophils Relative: 3
Monocytes Absolute: 0.4
Monocytes Relative: 5
Neutro Abs: 5

## 2011-01-01 LAB — POCT I-STAT, CHEM 8
Calcium, Ion: 0.9 — ABNORMAL LOW
Chloride: 108
Glucose, Bld: 114 — ABNORMAL HIGH
HCT: 44
TCO2: 11

## 2011-01-01 LAB — POCT CARDIAC MARKERS: Troponin i, poc: <0.05

## 2011-01-01 LAB — CBC: RBC: 4.7

## 2011-01-26 ENCOUNTER — Other Ambulatory Visit: Payer: Self-pay

## 2011-03-02 ENCOUNTER — Ambulatory Visit (INDEPENDENT_AMBULATORY_CARE_PROVIDER_SITE_OTHER): Payer: 59 | Admitting: Surgery

## 2011-03-02 ENCOUNTER — Encounter: Payer: Self-pay | Admitting: Surgery

## 2011-03-02 ENCOUNTER — Other Ambulatory Visit: Payer: Self-pay

## 2011-03-02 ENCOUNTER — Ambulatory Visit (INDEPENDENT_AMBULATORY_CARE_PROVIDER_SITE_OTHER): Payer: 59 | Admitting: Vascular Surgery

## 2011-03-02 ENCOUNTER — Ambulatory Visit: Payer: 59 | Admitting: Surgery

## 2011-03-02 DIAGNOSIS — I6529 Occlusion and stenosis of unspecified carotid artery: Secondary | ICD-10-CM

## 2011-03-02 NOTE — Progress Notes (Signed)
Vascular and Vein Specialist of [facility]   Patient name: Alison Dominguez MRN: [id number] DOB: 12-04-1950 Sex: female     Chief Complaint  Patient presents with  . Carotid    follow up with carotid duplex today    HISTORY OF PRESENT ILLNESS: The patient is here today for followup of carotid occlusive disease. She was last seen by myself in 2010. She has a history of 2 syncopal episodes. During her workup carotid ultrasound revealed moderate stenosis on the left side. I never felt that her carotid disease contributed to her syncopal episodes. We have been following her since then.  The patient has a history of MI as. She is medically managed for her hypercholesterolemia her most recent LDL cholesterol is less than 80  Past Medical History  Diagnosis Date  . TOBACCO ABUSE   . Multiple sclerosis   . CAROTID ARTERY STENOSIS, LEFT   . PVD   . SYNCOPE   . ANXIETY   . DYSLIPIDEMIA     Past Surgical History  Procedure Date  . Tubal ligation 1992  . Left foot surgery     History   Social History  . Marital Status: Single    Spouse Name: N/A    Number of Children: N/A  . Years of Education: N/A   Occupational History  . Not on file.   Social History Main Topics  . Smoking status: Current Everyday Smoker  . Smokeless tobacco: Not on file   Comment: Single, no children. Work in [employer]  . Alcohol Use: No  . Drug Use: No  . Sexually Active: Not Currently   Other Topics Concern  . Not on file   Social History Narrative  . No narrative on file    Family History  Problem Relation Age of Onset  . Diabetes Other     Family hx  . Hypertension Sister   . Diabetes Brother     Allergies as of 03/02/2011  . (No Known Allergies)    Current Outpatient Prescriptions on File Prior to Visit  Medication Sig Dispense Refill  . aspirin 81 MG tablet Take 81 mg by mouth daily.        Marland Kitchen ibuprofen (ADVIL,MOTRIN) 200 MG tablet Take 200 mg by mouth every 6 (six) hours as  needed.        . metaxalone (SKELAXIN) 800 MG tablet Take 1 tablet (800 mg total) by mouth 3 (three) times daily.  90 tablet  1  . PARoxetine (PAXIL) 20 MG tablet Take 20 mg by mouth every morning. As needed      . simvastatin (ZOCOR) 40 MG tablet Take 40 mg by mouth at bedtime.           REVIEW OF SYSTEMS: Positive for numbness in her feet and ankle dizziness all other review of systems are negative as documented in our encounter form  PHYSICAL EXAMINATION:   Vital signs are BP 108/74  Pulse 71  Ht 5\' 6"  (1.676 m)  Wt 148 lb (67.132 kg)  BMI 23.89 kg/m2  SpO2 98% General: The patient appears their stated age. HEENT:  No gross abnormalities Pulmonary:  Non labored breathing Musculoskeletal: There are no major deformities. Neurologic: No focal weakness or paresthesias are detected, Skin: There are no ulcer or rashes noted. Psychiatric: The patient has normal affect. Cardiovascular: There is a regular rate and rhythm without significant murmur appreciated. No carotid bruits   Diagnostic Studies Ultrasound was ordered and reviewed for today this shows a  slight increase in the right side the stenosis she now has bilateral 40-59% stenosis. Previously a right-sided been in the 1-39% range.  Assessment: Asymptomatic bilateral carotid stenosis Plan: I would recommend continuation of her current medical therapy including statin and antiplatelet therapy. She will come back in one year for repeat surveillance ultrasound of both carotid arteries.  Jorge Ny, M.D. [facility] [place] Office: [phone] Pager:  [phone]

## 2011-03-23 NOTE — Procedures (Unsigned)
CAROTID DUPLEX EXAM  INDICATION:  Carotid stenosis  HISTORY: Diabetes:  No Cardiac:  No Hypertension:  No Smoking:  Currently Previous Surgery:  No CV History:  Currently asymptomatic Amaurosis Fugax No, Paresthesias No, Hemiparesis No                                      RIGHT             LEFT Brachial systolic pressure:         96                98 Brachial Doppler waveforms:         WNL               WNL Vertebral direction of flow:        Antegrade         Antegrade DUPLEX VELOCITIES (cm/sec) CCA peak systolic                   102               67 ECA peak systolic                   87                89 ICA peak systolic                   141               151 ICA end diastolic                   41                59 PLAQUE MORPHOLOGY:                  Calcified         Calcified PLAQUE AMOUNT:                      Moderate          Moderate PLAQUE LOCATION:                    CCA/ICA           CCA/ICA  IMPRESSION: 1. Bilateral nonmobile fingerlike projection plaque present involving     the bilateral internal carotid arteries with stenosis estimated in     the 40%-59% range. 2. The plaque appears to have an intimal flap component and is     undeterminable if this is remnant of a previous dissection. 3. Increase on the right and stable on the left since previous study     on 08/19/2009.  ___________________________________________ V. Charlena Cross, MD  SH/MEDQ  D:  03/02/2011  T:  03/02/2011  Job:  [id number]

## 2011-04-07 ENCOUNTER — Other Ambulatory Visit: Payer: Self-pay | Admitting: Internal Medicine

## 2011-04-30 ENCOUNTER — Telehealth: Payer: Self-pay | Admitting: Internal Medicine

## 2011-04-30 DIAGNOSIS — E7800 Pure hypercholesterolemia, unspecified: Secondary | ICD-10-CM

## 2011-04-30 NOTE — Telephone Encounter (Signed)
Last labs 07/2010 with a lipid /liver panel. App. 05/04/2011 will ask Dr.Ross if she needs labs prior.

## 2011-04-30 NOTE — Telephone Encounter (Signed)
Called patient back and advised per Dr.Ross to have fasting lab work tomorrow.

## 2011-04-30 NOTE — Telephone Encounter (Signed)
Pt has fu appt with dr Tenny Craw, wants to know if needs blood work?

## 2011-05-01 ENCOUNTER — Other Ambulatory Visit (INDEPENDENT_AMBULATORY_CARE_PROVIDER_SITE_OTHER): Payer: 59 | Admitting: *Deleted

## 2011-05-01 DIAGNOSIS — E7800 Pure hypercholesterolemia, unspecified: Secondary | ICD-10-CM

## 2011-05-01 LAB — LIPID PANEL
HDL: 66.8 mg/dL (ref >39.00)
VLDL: 9.4 mg/dL (ref 0.0–40.0)

## 2011-05-04 ENCOUNTER — Ambulatory Visit (INDEPENDENT_AMBULATORY_CARE_PROVIDER_SITE_OTHER): Payer: 59 | Admitting: Internal Medicine

## 2011-05-04 ENCOUNTER — Encounter: Payer: Self-pay | Admitting: Internal Medicine

## 2011-05-04 VITALS — BP 110/72 | HR 88 | Ht 66.0 in | Wt 148.0 lb

## 2011-05-04 DIAGNOSIS — F17200 Nicotine dependence, unspecified, uncomplicated: Secondary | ICD-10-CM

## 2011-05-04 DIAGNOSIS — I2510 Atherosclerotic heart disease of native coronary artery without angina pectoris: Secondary | ICD-10-CM

## 2011-05-04 DIAGNOSIS — I6529 Occlusion and stenosis of unspecified carotid artery: Secondary | ICD-10-CM

## 2011-05-04 DIAGNOSIS — E785 Hyperlipidemia, unspecified: Secondary | ICD-10-CM

## 2011-05-04 MED ORDER — VARENICLINE TARTRATE 1 MG PO TABS: 1.0000 mg | ORAL_TABLET | Freq: Two times a day (BID) | ORAL | Status: AC

## 2011-05-04 MED ORDER — VARENICLINE TARTRATE 0.5 MG PO TABS: 0.5000 mg | ORAL_TABLET | Freq: Two times a day (BID) | ORAL | Status: AC

## 2011-05-04 MED ORDER — SIMVASTATIN 40 MG PO TABS: 40.0000 mg | ORAL_TABLET | Freq: Every day | ORAL | Status: DC

## 2011-05-04 NOTE — Assessment & Plan Note (Signed)
Counselled for 5 minutes on cessation.  Patient reflecting.  Willing to try Chantix again  Will Rx.

## 2011-05-04 NOTE — Progress Notes (Signed)
HPI Patient is a 61 year old with a history of CV disease, tobacco use, syncope and dyslipidemia.  I saw her in clinic 1 year ago.  Since then she has been seen by Willey Blade and Janae Bridgeman The patinet denies CP.  Breathing is OK  She continues to smoke a little less than 1 ppd.  Says she is not ready to quit. No Known Allergies  Current Outpatient Prescriptions  Medication Sig Dispense Refill  . aspirin 81 MG tablet Take 81 mg by mouth daily.        Marland Kitchen ibuprofen (ADVIL,MOTRIN) 200 MG tablet Take 200 mg by mouth every 6 (six) hours as needed.        . metaxalone (SKELAXIN) 800 MG tablet Take 1 tablet (800 mg total) by mouth 3 (three) times daily.  90 tablet  1  . PARoxetine (PAXIL) 20 MG tablet Take 20 mg by mouth every morning. As needed      . simvastatin (ZOCOR) 40 MG tablet TAKE 1 TABLET BY MOUTH DAILY AT BEDTIME  90 tablet  2    Past Medical History  Diagnosis Date  . TOBACCO ABUSE   . Multiple sclerosis   . CAROTID ARTERY STENOSIS, LEFT   . PVD   . SYNCOPE   . ANXIETY   . DYSLIPIDEMIA     Past Surgical History  Procedure Date  . Tubal ligation 1992  . Left foot surgery     Family History  Problem Relation Age of Onset  . Diabetes Other     Family hx  . Hypertension Sister   . Diabetes Brother     History   Social History  . Marital Status: Single    Spouse Name: N/A    Number of Children: N/A  . Years of Education: N/A   Occupational History  . Not on file.   Social History Main Topics  . Smoking status: Current Everyday Smoker  . Smokeless tobacco: Not on file   Comment: Single, no children. Work in [employer]  . Alcohol Use: No  . Drug Use: No  . Sexually Active: Not Currently   Other Topics Concern  . Not on file   Social History Narrative  . No narrative on file    Review of Systems:  All systems reviewed.  They are negative to the above problem except as previously stated.  Vital Signs: BP 110/72  Pulse 88  Ht 5\' 6"  (1.676 m)   Wt 148 lb (67.132 kg)  BMI 23.89 kg/m2  Physical Exam  HEENT:  Normocephalic, atraumatic. EOMI, PERRLA.  Neck: JVP is normal. No thyromegaly. No bruits.  Lungs: clear to auscultation. No rales no wheezes.  Heart: Regular rate and rhythm. Normal S1, S2. No S3.   No significant murmurs. PMI not displaced.  Abdomen:  Supple, nontender. Normal bowel sounds. No masses. No hepatomegaly.  Extremities:   Good distal pulses throughout. No lower extremity edema.  Musculoskeletal :moving all extremities.  Neuro:   alert and oriented x3.  CN II-XII grossly intact.  EKG:  Sinus rhythm 88 bpm.  Assessment and Plan:

## 2011-05-04 NOTE — Assessment & Plan Note (Signed)
Keep on statin.  Excellent control.

## 2011-05-04 NOTE — Assessment & Plan Note (Signed)
Patient to follow up in vascular clinic.

## 2011-05-07 ENCOUNTER — Telehealth: Payer: Self-pay | Admitting: Internal Medicine

## 2011-05-07 NOTE — Telephone Encounter (Signed)
New Msg: Pt calling wanting to speak to nurse/MD regarding paperwork pt might have left at our office. Paperwork is about RX's nurse called in. Pt said she cannot find paperwork at home and wanted to know if she left it here. Please return pt call to discuss further.

## 2011-05-08 NOTE — Telephone Encounter (Signed)
Pt calling again regarding her check out paperwork she was wondering did you mail them to her

## 2011-05-08 NOTE — Telephone Encounter (Signed)
Called patient back. She placed her check out paper in the medication card envelope and found it when I was on the phone with her.

## 2011-05-22 ENCOUNTER — Telehealth: Payer: Self-pay | Admitting: Internal Medicine

## 2011-05-22 NOTE — Telephone Encounter (Signed)
New problem:  Per Marisue Ivan, need to verify the quantity of chantix.

## 2011-05-22 NOTE — Telephone Encounter (Signed)
Faxed clarification to pharmacy

## 2012-02-10 ENCOUNTER — Telehealth: Payer: Self-pay | Admitting: *Deleted

## 2012-02-10 DIAGNOSIS — Z0000 Encounter for general adult medical examination without abnormal findings: Secondary | ICD-10-CM

## 2012-02-10 NOTE — Telephone Encounter (Signed)
Message copied by Deatra James on Wed Feb 10, 2012  2:05 PM ------      Message from: Etheleen Sia      Created: Wed Feb 10, 2012 12:50 PM      Regarding: LAB       PHYSICAL LABS IN DEC

## 2012-02-10 NOTE — Telephone Encounter (Signed)
Received staff msg pt made cpx for December. Entering cpx labs in epic...Raechel Chute

## 2012-02-29 ENCOUNTER — Encounter: Payer: 59 | Admitting: Internal Medicine

## 2012-03-07 ENCOUNTER — Other Ambulatory Visit: Payer: 59

## 2012-03-07 ENCOUNTER — Ambulatory Visit: Payer: 59 | Admitting: Surgery

## 2012-03-23 ENCOUNTER — Other Ambulatory Visit: Payer: Self-pay | Admitting: *Deleted

## 2012-03-23 DIAGNOSIS — I6529 Occlusion and stenosis of unspecified carotid artery: Secondary | ICD-10-CM

## 2012-03-25 ENCOUNTER — Encounter: Payer: Self-pay | Admitting: Surgery

## 2012-03-28 ENCOUNTER — Ambulatory Visit (INDEPENDENT_AMBULATORY_CARE_PROVIDER_SITE_OTHER): Payer: BC Managed Care – PPO | Admitting: Internal Medicine

## 2012-03-28 ENCOUNTER — Other Ambulatory Visit (INDEPENDENT_AMBULATORY_CARE_PROVIDER_SITE_OTHER): Payer: BC Managed Care – PPO | Admitting: *Deleted

## 2012-03-28 ENCOUNTER — Encounter: Payer: Self-pay | Admitting: Internal Medicine

## 2012-03-28 ENCOUNTER — Other Ambulatory Visit (INDEPENDENT_AMBULATORY_CARE_PROVIDER_SITE_OTHER): Payer: BC Managed Care – PPO

## 2012-03-28 ENCOUNTER — Encounter: Payer: Self-pay | Admitting: Neurosurgery

## 2012-03-28 ENCOUNTER — Encounter: Payer: 59 | Admitting: Internal Medicine

## 2012-03-28 ENCOUNTER — Ambulatory Visit (INDEPENDENT_AMBULATORY_CARE_PROVIDER_SITE_OTHER): Payer: BC Managed Care – PPO | Admitting: Neurosurgery

## 2012-03-28 VITALS — BP 102/72 | HR 98 | Temp 98.0°F | Ht 66.0 in | Wt 159.2 lb

## 2012-03-28 VITALS — BP 106/72 | HR 83 | Resp 16 | Ht 66.0 in | Wt 159.0 lb

## 2012-03-28 DIAGNOSIS — Z0000 Encounter for general adult medical examination without abnormal findings: Secondary | ICD-10-CM

## 2012-03-28 DIAGNOSIS — I6529 Occlusion and stenosis of unspecified carotid artery: Secondary | ICD-10-CM

## 2012-03-28 DIAGNOSIS — F17200 Nicotine dependence, unspecified, uncomplicated: Secondary | ICD-10-CM

## 2012-03-28 DIAGNOSIS — F411 Generalized anxiety disorder: Secondary | ICD-10-CM

## 2012-03-28 DIAGNOSIS — E785 Hyperlipidemia, unspecified: Secondary | ICD-10-CM

## 2012-03-28 DIAGNOSIS — Z1211 Encounter for screening for malignant neoplasm of colon: Secondary | ICD-10-CM

## 2012-03-28 DIAGNOSIS — Z23 Encounter for immunization: Secondary | ICD-10-CM

## 2012-03-28 LAB — BASIC METABOLIC PANEL
Chloride: 104 mEq/L (ref 96–112)
Potassium: 4.1 mEq/L (ref 3.5–5.1)

## 2012-03-28 LAB — URINALYSIS, ROUTINE W REFLEX MICROSCOPIC
Bilirubin Urine: NEGATIVE
Ketones, ur: NEGATIVE
Nitrite: NEGATIVE
Total Protein, Urine: NEGATIVE
pH: 6.5 (ref 5.0–8.0)

## 2012-03-28 LAB — CBC WITH DIFFERENTIAL/PLATELET
Basophils Relative: 1.1 % (ref 0.0–3.0)
Eosinophils Relative: 2.6 % (ref 0.0–5.0)
HCT: 41.5 % (ref 36.0–46.0)
Lymphs Abs: 1.6 10*3/uL (ref 0.7–4.0)
MCV: 91.2 fl (ref 78.0–100.0)
Monocytes Absolute: 0.4 10*3/uL (ref 0.1–1.0)
Neutro Abs: 3.4 10*3/uL (ref 1.4–7.7)
RBC: 4.55 Mil/uL (ref 3.87–5.11)
WBC: 5.6 10*3/uL (ref 4.5–10.5)

## 2012-03-28 LAB — HEPATIC FUNCTION PANEL
ALT: 15 U/L (ref 0–35)
Alkaline Phosphatase: 42 U/L (ref 39–117)
Bilirubin, Direct: 0.1 mg/dL (ref 0.0–0.3)
Total Protein: 5.8 g/dL — ABNORMAL LOW (ref 6.0–8.3)

## 2012-03-28 LAB — LIPID PANEL
Cholesterol: 147 mg/dL (ref 0–200)
LDL Cholesterol: 77 mg/dL (ref 0–99)
Total CHOL/HDL Ratio: 2

## 2012-03-28 MED ORDER — SIMVASTATIN 40 MG PO TABS: 40.0000 mg | ORAL_TABLET | Freq: Every day | ORAL | Status: DC

## 2012-03-28 MED ORDER — TIZANIDINE HCL 4 MG PO TABS: 4.0000 mg | ORAL_TABLET | Freq: Three times a day (TID) | ORAL | Status: DC | PRN

## 2012-03-28 MED ORDER — PAROXETINE HCL 20 MG PO TABS: 20.0000 mg | ORAL_TABLET | ORAL | Status: DC

## 2012-03-28 NOTE — Progress Notes (Signed)
[facility]IN [facility]ISTS OF [place] Carotid Office Note  CC: Carotid surveillance Referring Physician: Brabham  History of Present Illness: 61 year old female patient of Dr. Myra Gianotti with no history of carotid intervention. The patient denies any signs or symptoms of CVA, TIA, amaurosis fugax or any neural deficit. The patient denies any new medical diagnoses or recent surgery.  Past Medical History  Diagnosis Date  . TOBACCO ABUSE   . Multiple sclerosis   . CAROTID ARTERY STENOSIS, LEFT   . PVD   . SYNCOPE   . ANXIETY   . DYSLIPIDEMIA     ROS: [x]  Positive   [ ]  Denies    General: [ ]  Weight loss, [ ]  Fever, [ ]  chills Neurologic: [ ]  Dizziness, [ ]  Blackouts, [ ]  Seizure [ ]  Stroke, [ ]  "Mini stroke", [ ]  Slurred speech, [ ]  Temporary blindness; [ ]  weakness in arms or legs, [ ]  Hoarseness Cardiac: [ ]  Chest pain/pressure, [ ]  Shortness of breath at rest [ ]  Shortness of breath with exertion, [ ]  Atrial fibrillation or irregular heartbeat Vascular: [ ]  Pain in legs with walking, [ ]  Pain in legs at rest, [ ]  Pain in legs at night,  [ ]  Non-healing ulcer, [ ]  Blood clot in vein/DVT,   Pulmonary: [ ]  Home oxygen, [ ]  Productive cough, [ ]  Coughing up blood, [ ]  Asthma,  [ ]  Wheezing Musculoskeletal:  [ ]  Arthritis, [ ]  Low back pain, [ ]  Joint pain Hematologic: [ ]  Easy Bruising, [ ]  Anemia; [ ]  Hepatitis Gastrointestinal: [ ]  Blood in stool, [ ]  Gastroesophageal Reflux/heartburn, [ ]  Trouble swallowing Urinary: [ ]  chronic Kidney disease, [ ]  on HD - [ ]  MWF or [ ]  TTHS, [ ]  Burning with urination, [ ]  Difficulty urinating Skin: [ ]  Rashes, [ ]  Wounds Psychological: [ ]  Anxiety, [ ]  Depression   Social History History  Substance Use Topics  . Smoking status: Current Every Day Smoker  . Smokeless tobacco: Not on file     Comment: Single, no children. Work in [employer]  . Alcohol Use: No    Family History Family History  Problem Relation Age of Onset  .  Diabetes Other     Family hx  . Hypertension Sister   . Diabetes Brother     No Known Allergies  Current Outpatient Prescriptions  Medication Sig Dispense Refill  . aspirin 81 MG tablet Take 81 mg by mouth daily.        Marland Kitchen ibuprofen (ADVIL,MOTRIN) 200 MG tablet Take 200 mg by mouth every 6 (six) hours as needed.        Marland Kitchen PARoxetine (PAXIL) 20 MG tablet Take 20 mg by mouth every morning. As needed      . simvastatin (ZOCOR) 40 MG tablet Take 1 tablet (40 mg total) by mouth at bedtime.  90 tablet  3  . tiZANidine (ZANAFLEX) 4 MG tablet Take 4 mg by mouth continuous as needed.        Physical Examination  Filed Vitals:   03/28/12 1227  BP: 106/72  Pulse: 83  Resp:     Body mass index is 25.66 kg/(m^2).  General:  WDWN in NAD Gait: Normal HEENT: WNL Eyes: Pupils equal Pulmonary: normal non-labored breathing , without Rales, rhonchi,  wheezing Cardiac: RRR, without  Murmurs, rubs or gallops; Abdomen: soft, NT, no masses Skin: no rashes, ulcers noted  Vascular Exam Pulses: 3+ radial pulses bilaterally Carotid bruits: Carotid pulses to auscultation no  bruits are heard Extremities without ischemic changes, no Gangrene , no cellulitis; no open wounds;  Musculoskeletal: no muscle wasting or atrophy   Neurologic: A&O X 3; Appropriate Affect ; SENSATION: normal; MOTOR FUNCTION:  moving all extremities equally. Speech is fluent/normal  Non-Invasive Vascular Imaging CAROTID DUPLEX 03/28/2012  Right ICA 40 - 59 % stenosis Left ICA 40 - 59 % stenosis   ASSESSMENT/PLAN: Asymptomatic patient with no change and duplex from previous exam. The patient will followup in one year with repeat carotid duplex. The patient's questions were encouraged and answered, she is in agreement with this plan.  Lauree Chandler ANP   Clinic MD: Myra Gianotti

## 2012-03-28 NOTE — Assessment & Plan Note (Signed)
Stable ICAS 60% since 2010 - no symptomatic disease Follows with vasc annually Advised on med control and importance of smoking cessation

## 2012-03-28 NOTE — Patient Instructions (Addendum)
It was good to see you today. Health Maintenance reviewed - pneumonia vaccine given today -all other recommended immunizations and age-appropriate screenings are up-to-date. we'll make referral to GI for colonoscopy screening. Our office will contact you regarding appointment(s) once made. We have reviewed your prior records including labs and tests today Continue to think about giving up cigarettes! Use nicotine gum, nicotine patches or electronic cigarettes. If you're interested in medication to help you quit, please call  - let me know how I can help! Please schedule followup in 12 months, call sooner if problems.  Health Maintenance, Females A healthy lifestyle and preventative care can promote health and wellness.  Maintain regular health, dental, and eye exams.   Eat a healthy diet. Foods like vegetables, fruits, whole grains, low-fat dairy products, and lean protein foods contain the nutrients you need without too many calories. Decrease your intake of foods high in solid fats, added sugars, and salt. Get information about a proper diet from your caregiver, if necessary.   Regular physical exercise is one of the most important things you can do for your health. Most adults should get at least 150 minutes of moderate-intensity exercise (any activity that increases your heart rate and causes you to sweat) each week. In addition, most adults need muscle-strengthening exercises on 2 or more days a week.     Maintain a healthy weight. The body mass index (BMI) is a screening tool to identify possible weight problems. It provides an estimate of body fat based on height and weight. Your caregiver can help determine your BMI, and can help you achieve or maintain a healthy weight. For adults 20 years and older:   A BMI below 18.5 is considered underweight.   A BMI of 18.5 to 24.9 is normal.   A BMI of 25 to 29.9 is considered overweight.   A BMI of 30 and above is considered obese.   Maintain  normal blood lipids and cholesterol by exercising and minimizing your intake of saturated fat. Eat a balanced diet with plenty of fruits and vegetables. Blood tests for lipids and cholesterol should begin at age 81 and be repeated every 5 years. If your lipid or cholesterol levels are high, you are over 50, or you are a high risk for heart disease, you may need your cholesterol levels checked more frequently. Ongoing high lipid and cholesterol levels should be treated with medicines if diet and exercise are not effective.   If you smoke, find out from your caregiver how to quit. If you do not use tobacco, do not start.   If you are pregnant, do not drink alcohol. If you are breastfeeding, be very cautious about drinking alcohol. If you are not pregnant and choose to drink alcohol, do not exceed 1 drink per day. One drink is considered to be 12 ounces (355 mL) of beer, 5 ounces (148 mL) of wine, or 1.5 ounces (44 mL) of liquor.   Avoid use of street drugs. Do not share needles with anyone. Ask for help if you need support or instructions about stopping the use of drugs.   High blood pressure causes heart disease and increases the risk of stroke. Blood pressure should be checked at least every 1 to 2 years. Ongoing high blood pressure should be treated with medicines, if weight loss and exercise are not effective.   If you are 61 to 62 years old, ask your caregiver if you should take aspirin to prevent strokes.   Diabetes screening  involves taking a blood sample to check your fasting blood sugar level. This should be done once every 3 years, after age 42, if you are within normal weight and without risk factors for diabetes. Testing should be considered at a younger age or be carried out more frequently if you are overweight and have at least 1 risk factor for diabetes.   Breast cancer screening is essential preventative care for women. You should practice "breast self-awareness." This means  understanding the normal appearance and feel of your breasts and may include breast self-examination. Any changes detected, no matter how small, should be reported to a caregiver. Women in their 29s and 30s should have a clinical breast exam (CBE) by a caregiver as part of a regular health exam every 1 to 3 years. After age 94, women should have a CBE every year. Starting at age 40, women should consider having a mammogram (breast X-ray) every year. Women who have a family history of breast cancer should talk to their caregiver about genetic screening. Women at a high risk of breast cancer should talk to their caregiver about having an MRI and a mammogram every year.   The Pap test is a screening test for cervical cancer. Women should have a Pap test starting at age 47. Between ages 20 and 15, Pap tests should be repeated every 2 years. Beginning at age 40, you should have a Pap test every 3 years as long as the past 3 Pap tests have been normal. If you had a hysterectomy for a problem that was not cancer or a condition that could lead to cancer, then you no longer need Pap tests. If you are between ages 69 and 60, and you have had normal Pap tests going back 10 years, you no longer need Pap tests. If you have had past treatment for cervical cancer or a condition that could lead to cancer, you need Pap tests and screening for cancer for at least 20 years after your treatment. If Pap tests have been discontinued, risk factors (such as a new sexual partner) need to be reassessed to determine if screening should be resumed. Some women have medical problems that increase the chance of getting cervical cancer. In these cases, your caregiver may recommend more frequent screening and Pap tests.   The human papillomavirus (HPV) test is an additional test that may be used for cervical cancer screening. The HPV test looks for the virus that can cause the cell changes on the cervix. The cells collected during the Pap test  can be tested for HPV. The HPV test could be used to screen women aged 26 years and older, and should be used in women of any age who have unclear Pap test results. After the age of 34, women should have HPV testing at the same frequency as a Pap test.   Colorectal cancer can be detected and often prevented. Most routine colorectal cancer screening begins at the age of 79 and continues through age 27. However, your caregiver may recommend screening at an earlier age if you have risk factors for colon cancer. On a yearly basis, your caregiver may provide home test kits to check for hidden blood in the stool. Use of a small camera at the end of a tube, to directly examine the colon (sigmoidoscopy or colonoscopy), can detect the earliest forms of colorectal cancer. Talk to your caregiver about this at age 62, when routine screening begins. Direct examination of the colon should be repeated  every 5 to 10 years through age 72, unless early forms of pre-cancerous polyps or small growths are found.   Hepatitis C blood testing is recommended for all people born from [birth year] through 1966 and any individual with known risks for hepatitis C.   Practice safe sex. Use condoms and avoid high-risk sexual practices to reduce the spread of sexually transmitted infections (STIs). Sexually active women aged 73 and younger should be checked for Chlamydia, which is a common sexually transmitted infection. Older women with new or multiple partners should also be tested for Chlamydia. Testing for other STIs is recommended if you are sexually active and at increased risk.   Osteoporosis is a disease in which the bones lose minerals and strength with aging. This can result in serious bone fractures. The risk of osteoporosis can be identified using a bone density scan. Women ages 38 and over and women at risk for fractures or osteoporosis should discuss screening with their caregivers. Ask your caregiver whether you should be taking a  calcium supplement or vitamin D to reduce the rate of osteoporosis.   Menopause can be associated with physical symptoms and risks. Hormone replacement therapy is available to decrease symptoms and risks. You should talk to your caregiver about whether hormone replacement therapy is right for you.   Use sunscreen with a sun protection factor (SPF) of 30 or greater. Apply sunscreen liberally and repeatedly throughout the day. You should seek shade when your shadow is shorter than you. Protect yourself by wearing long sleeves, pants, a wide-brimmed hat, and sunglasses year round, whenever you are outdoors.   Notify your caregiver of new moles or changes in moles, especially if there is a change in shape or color. Also notify your caregiver if a mole is larger than the size of a pencil eraser.   Stay current with your immunizations.  Document Released: 10/06/2010 Document Revised: 06/15/2011 Document Reviewed: 10/06/2010 [facility] Patient Information 2013 [place], [place].   You Can Quit Smoking If you are ready to quit smoking or are thinking about it, congratulations! You have chosen to help yourself be healthier and live longer! There are lots of different ways to quit smoking. Nicotine gum, nicotine patches, a nicotine inhaler, or nicotine nasal spray can help with physical craving. Hypnosis, support groups, and medicines help break the habit of smoking. TIPS TO GET OFF AND STAY OFF CIGARETTES  Learn to predict your moods. Do not let a bad situation be your excuse to have a cigarette. Some situations in your life might tempt you to have a cigarette.   Ask friends and co-workers not to smoke around you.   Make your home smoke-free.   Never have "just one" cigarette. It leads to wanting another and another. Remind yourself of your decision to quit.   On a card, make a list of your reasons for not smoking. Read it at least the same number of times a day as you have a cigarette. Tell yourself  everyday, "I do not want to smoke. I choose not to smoke."   Ask someone at home or work to help you with your plan to quit smoking.   Have something planned after you eat or have a cup of coffee. Take a walk or get other exercise to perk you up. This will help to keep you from overeating.   Try a relaxation exercise to calm you down and decrease your stress. Remember, you may be tense and nervous the first two weeks after you  quit. This will pass.   Find new activities to keep your hands busy. Play with a pen, coin, or rubber band. Doodle or draw things on paper.   Brush your teeth right after eating. This will help cut down the craving for the taste of tobacco after meals. You can try mouthwash too.   Try gum, breath mints, or diet candy to keep something in your mouth.  IF YOU SMOKE AND WANT TO QUIT:  Do not stock up on cigarettes. Never buy a carton. Wait until one pack is finished before you buy another.   Never carry cigarettes with you at work or at home.   Keep cigarettes as far away from you as possible. Leave them with someone else.   Never carry matches or a lighter with you.   Ask yourself, "Do I need this cigarette or is this just a reflex?"   Bet with someone that you can quit. Put cigarette money in a piggy bank every morning. If you smoke, you give up the money. If you do not smoke, by the end of the week, you keep the money.   Keep trying. It takes 21 days to change a habit!   Talk to your doctor about using medicines to help you quit. These include nicotine replacement gum, lozenges, or skin patches.  Document Released: 01/17/2009 Document Revised: 06/15/2011 Document Reviewed: 01/17/2009 [facility] Patient Information 2013 [place], [place].

## 2012-03-28 NOTE — Progress Notes (Signed)
Subjective:    Patient ID: Alison Dominguez, female    DOB: Dec 30, 1950, 61 y.o.   MRN: [id number]  HPI  patient is here today for annual physical. Patient feels well and has no complaints.  Also reviewed chronic medical issues:  Dyslipidemia - on statin since syncope event in 2010 - the patient reports compliance with medication(s) as prescribed. Denies adverse side effects.  PVD - L ICA stenosis - 60% stable on serial doppler since 2010 - no headache, vision change or TIA symptoms - no recurrent syncope  MS - currently off med tx - prev IFN shots, stopped due to $$ - no recent flares or exacerbations, chronic fatigue symptoms   Smoking - considering cessation - prev tried chantix, did well without adverse side effects but did not maintain efforts  Past Medical History  Diagnosis Date  . TOBACCO ABUSE   . Multiple sclerosis   . CAROTID ARTERY STENOSIS, LEFT   . PVD   . ANXIETY   . DYSLIPIDEMIA    Family History  Problem Relation Age of Onset  . Diabetes Other     Family hx  . Hypertension Sister   . Diabetes Brother    History  Substance Use Topics  . Smoking status: Current Every Day Smoker  . Smokeless tobacco: Not on file     Comment: Single, no children. working Community education officer in [employer]   . Alcohol Use: No    Review of Systems  Constitutional: Negative for fever.  Respiratory: Negative for cough and shortness of breath.   Cardiovascular: Negative for chest pain.  Gastrointestinal: Negative for abdominal pain.  Musculoskeletal: Negative for gait problem.  Skin: Negative for rash.  Neurological: Negative for dizziness.  No other specific complaints in a complete review of systems (except as listed in HPI above).     Objective:   Physical Exam  BP 102/72  Pulse 98  Temp 98 F (36.7 C) (Oral)  Ht 5\' 6"  (1.676 m)  Wt 159 lb 3.2 oz (72.213 kg)  BMI 25.70 kg/m2  SpO2 94%   Wt Readings from Last 3 Encounters:  03/28/12 159 lb 3.2 oz (72.213 kg)  03/28/12 159 lb  (72.122 kg)  05/04/11 148 lb (67.132 kg)  Constitutional: She appears well-developed and well-nourished. No distress.  HENT: Head: Normocephalic and atraumatic. Ears: B TMs ok, no erythema or effusion; Nose: Nose normal. Mouth/Throat: Oropharynx is clear and moist. No oropharyngeal exudate.  Eyes: Conjunctivae and EOM are normal. Pupils are equal, round, and reactive to light. No scleral icterus.  Neck: Normal range of motion. Neck supple. No JVD present. No thyromegaly present.  Cardiovascular: Normal rate, regular rhythm and normal heart sounds.  No murmur heard. No BLE edema. Pulmonary/Chest: Effort normal and breath sounds normal. No respiratory distress. She has no wheezes.  Abdominal: Soft. Bowel sounds are normal. She exhibits no distension. There is no tenderness. no masses Musculoskeletal: Normal range of motion, no joint effusions. No gross deformities Neurological: She is alert and oriented to person, place, and time. No cranial nerve deficit. Coordination normal.  Skin: Skin is warm and dry. No rash noted. No erythema.  Psychiatric: She has a normal mood and affect. Her behavior is normal. Judgment and thought content normal.       Lab Results  Component Value Date   WBC 5.6 03/28/2012   HGB 14.0 03/28/2012   HCT 41.5 03/28/2012   PLT 209.0 03/28/2012   CHOL 147 03/28/2012   TRIG 53.0 03/28/2012   HDL  59.80 03/28/2012   ALT 15 03/28/2012   AST 13 03/28/2012   NA 138 03/28/2012   K 4.1 03/28/2012   CL 104 03/28/2012   CREATININE 0.7 03/28/2012   BUN 14 03/28/2012   CO2 28 03/28/2012   TSH 0.88 03/28/2012   ECG today: NSR @ 100 bpm - AE, no ischemic changes Assessment & Plan:   CPX - Patient has been counseled on age-appropriate routine health concerns for screening and prevention. These are reviewed and up-to-date. Immunizations are up-to-date or declined. Labs and ECG reviewed (NSR, no ischemic change)  See problem list. Medications and labs reviewed today.

## 2012-03-28 NOTE — Assessment & Plan Note (Signed)
5 minutes today spent counseling patient on unhealthy effects of continued tobacco abuse and encouragement of cessation including medical options available to help the patient quit smoking. Prior use chantix ineffective due to lack of commitment to cessation

## 2012-03-28 NOTE — Assessment & Plan Note (Signed)
Uses paxil "prn" - reviewed prescribed indication for daily use and encouraged compliance as prescribed

## 2012-03-28 NOTE — Assessment & Plan Note (Signed)
On statin - last lipids reviewed, goal <70 given PVD The current medical regimen is effective;  continue present plan and medications.

## 2013-02-22 ENCOUNTER — Encounter: Payer: Self-pay | Admitting: Internal Medicine

## 2013-03-28 ENCOUNTER — Ambulatory Visit: Payer: BC Managed Care – PPO | Admitting: Family

## 2013-03-28 ENCOUNTER — Other Ambulatory Visit (HOSPITAL_COMMUNITY): Payer: BC Managed Care – PPO

## 2014-05-28 ENCOUNTER — Other Ambulatory Visit: Payer: Self-pay | Admitting: *Deleted

## 2014-05-28 DIAGNOSIS — I6523 Occlusion and stenosis of bilateral carotid arteries: Secondary | ICD-10-CM

## 2014-05-29 ENCOUNTER — Encounter: Payer: Self-pay | Admitting: Family

## 2014-05-30 ENCOUNTER — Ambulatory Visit (INDEPENDENT_AMBULATORY_CARE_PROVIDER_SITE_OTHER): Payer: Medicare Other | Admitting: Family

## 2014-05-30 ENCOUNTER — Ambulatory Visit (HOSPITAL_COMMUNITY)
Admission: RE | Admit: 2014-05-30 | Discharge: 2014-05-30 | Disposition: A | Discharge status: Home / Self Care | Payer: Medicare Other | Source: Ambulatory Visit | Attending: Family | Admitting: Family

## 2014-05-30 ENCOUNTER — Encounter: Payer: Self-pay | Admitting: Family

## 2014-05-30 VITALS — BP 109/74 | HR 92 | Resp 16 | Ht 66.0 in | Wt 168.0 lb

## 2014-05-30 DIAGNOSIS — Z720 Tobacco use: Secondary | ICD-10-CM

## 2014-05-30 DIAGNOSIS — I6523 Occlusion and stenosis of bilateral carotid arteries: Secondary | ICD-10-CM

## 2014-05-30 DIAGNOSIS — F17200 Nicotine dependence, unspecified, uncomplicated: Secondary | ICD-10-CM | POA: Insufficient documentation

## 2014-05-30 NOTE — Patient Instructions (Addendum)
Stroke Prevention Some medical conditions and behaviors are associated with an increased chance of having a stroke. You may prevent a stroke by making healthy choices and managing medical conditions. HOW CAN I REDUCE MY RISK OF HAVING A STROKE?   Stay physically active. Get at least 30 minutes of activity on most or all days.  Do not smoke. It may also be helpful to avoid exposure to secondhand smoke.  Limit alcohol use. Moderate alcohol use is considered to be:  No more than 2 drinks per day for men.  No more than 1 drink per day for nonpregnant women.  Eat healthy foods. This involves:  Eating 5 or more servings of fruits and vegetables a day.  Making dietary changes that address high blood pressure (hypertension), high cholesterol, diabetes, or obesity.  Manage your cholesterol levels.  Making food choices that are high in fiber and low in saturated fat, trans fat, and cholesterol may control cholesterol levels.  Take any prescribed medicines to control cholesterol as directed by your health care provider.  Manage your diabetes.  Controlling your carbohydrate and sugar intake is recommended to manage diabetes.  Take any prescribed medicines to control diabetes as directed by your health care provider.  Control your hypertension.  Making food choices that are low in salt (sodium), saturated fat, trans fat, and cholesterol is recommended to manage hypertension.  Take any prescribed medicines to control hypertension as directed by your health care provider.  Maintain a healthy weight.  Reducing calorie intake and making food choices that are low in sodium, saturated fat, trans fat, and cholesterol are recommended to manage weight.  Stop drug abuse.  Avoid taking birth control pills.  Talk to your health care provider about the risks of taking birth control pills if you are over 35 years old, smoke, get migraines, or have ever had a blood clot.  Get evaluated for sleep  disorders (sleep apnea).  Talk to your health care provider about getting a sleep evaluation if you snore a lot or have excessive sleepiness.  Take medicines only as directed by your health care provider.  For some people, aspirin or blood thinners (anticoagulants) are helpful in reducing the risk of forming abnormal blood clots that can lead to stroke. If you have the irregular heart rhythm of atrial fibrillation, you should be on a blood thinner unless there is a good reason you cannot take them.  Understand all your medicine instructions.  Make sure that other conditions (such as anemia or atherosclerosis) are addressed. SEEK IMMEDIATE MEDICAL CARE IF:   You have sudden weakness or numbness of the face, arm, or leg, especially on one side of the body.  Your face or eyelid droops to one side.  You have sudden confusion.  You have trouble speaking (aphasia) or understanding.  You have sudden trouble seeing in one or both eyes.  You have sudden trouble walking.  You have dizziness.  You have a loss of balance or coordination.  You have a sudden, severe headache with no known cause.  You have new chest pain or an irregular heartbeat. Any of these symptoms may represent a serious problem that is an emergency. Do not wait to see if the symptoms will go away. Get medical help at once. Call your local emergency services (911 in [place] Do not drive yourself to the hospital. Document Released: 04/30/2004 Document Revised: 08/07/2013 Document Reviewed: 09/23/2012 ExitCare Patient Information 2015 [employer]. This information is not intended to replace advice given   to you by your health care provider. Make sure you discuss any questions you have with your health care provider.    Smoking Cessation Quitting smoking is important to your health and has many advantages. However, it is not always easy to quit since nicotine is a very addictive drug. Oftentimes, people try 3 times or  more before being able to quit. This document explains the best ways for you to prepare to quit smoking. Quitting takes hard work and a lot of effort, but you can do it. ADVANTAGES OF QUITTING SMOKING  You will live longer, feel better, and live better.  Your body will feel the impact of quitting smoking almost immediately.  Within 20 minutes, blood pressure decreases. Your pulse returns to its normal level.  After 8 hours, carbon monoxide levels in the blood return to normal. Your oxygen level increases.  After 24 hours, the chance of having a heart attack starts to decrease. Your breath, hair, and body stop smelling like smoke.  After 48 hours, damaged nerve endings begin to recover. Your sense of taste and smell improve.  After 72 hours, the body is virtually free of nicotine. Your bronchial tubes relax and breathing becomes easier.  After 2 to 12 weeks, lungs can hold more air. Exercise becomes easier and circulation improves.  The risk of having a heart attack, stroke, cancer, or lung disease is greatly reduced.  After 1 year, the risk of coronary heart disease is cut in half.  After 5 years, the risk of stroke falls to the same as a nonsmoker.  After 10 years, the risk of lung cancer is cut in half and the risk of other cancers decreases significantly.  After 15 years, the risk of coronary heart disease drops, usually to the level of a nonsmoker.  If you are pregnant, quitting smoking will improve your chances of having a healthy baby.  The people you live with, especially any children, will be healthier.  You will have extra money to spend on things other than cigarettes. QUESTIONS TO THINK ABOUT BEFORE ATTEMPTING TO QUIT You may want to talk about your answers with your health care provider.  Why do you want to quit?  If you tried to quit in the past, what helped and what did not?  What will be the most difficult situations for you after you quit? How will you plan to  handle them?  Who can help you through the tough times? Your family? Friends? A health care provider?  What pleasures do you get from smoking? What ways can you still get pleasure if you quit? Here are some questions to ask your health care provider:  How can you help me to be successful at quitting?  What medicine do you think would be best for me and how should I take it?  What should I do if I need more help?  What is smoking withdrawal like? How can I get information on withdrawal? GET READY  Set a quit date.  Change your environment by getting rid of all cigarettes, ashtrays, matches, and lighters in your home, car, or work. Do not let people smoke in your home.  Review your past attempts to quit. Think about what worked and what did not. GET SUPPORT AND ENCOURAGEMENT You have a better chance of being successful if you have help. You can get support in many ways.  Tell your family, friends, and coworkers that you are going to quit and need their support. Ask them   not to smoke around you.  Get individual, group, or telephone counseling and support. Programs are available at local hospitals and health centers. Call your local health department for information about programs in your area.  Spiritual beliefs and practices may help some smokers quit.  Download a "quit meter" on your computer to keep track of quit statistics, such as how long you have gone without smoking, cigarettes not smoked, and money saved.  Get a self-help book about quitting smoking and staying off tobacco. LEARN NEW SKILLS AND BEHAVIORS  Distract yourself from urges to smoke. Talk to someone, go for a walk, or occupy your time with a task.  Change your normal routine. Take a different route to work. Drink tea instead of coffee. Eat breakfast in a different place.  Reduce your stress. Take a hot bath, exercise, or read a book.  Plan something enjoyable to do every day. Reward yourself for not  smoking.  Explore interactive web-based programs that specialize in helping you quit. GET MEDICINE AND USE IT CORRECTLY Medicines can help you stop smoking and decrease the urge to smoke. Combining medicine with the above behavioral methods and support can greatly increase your chances of successfully quitting smoking.  Nicotine replacement therapy helps deliver nicotine to your body without the negative effects and risks of smoking. Nicotine replacement therapy includes nicotine gum, lozenges, inhalers, nasal sprays, and skin patches. Some may be available over-the-counter and others require a prescription.  Antidepressant medicine helps people abstain from smoking, but how this works is unknown. This medicine is available by prescription.  Nicotinic receptor partial agonist medicine simulates the effect of nicotine in your brain. This medicine is available by prescription. Ask your health care provider for advice about which medicines to use and how to use them based on your health history. Your health care provider will tell you what side effects to look out for if you choose to be on a medicine or therapy. Carefully read the information on the package. Do not use any other product containing nicotine while using a nicotine replacement product.  RELAPSE OR DIFFICULT SITUATIONS Most relapses occur within the first 3 months after quitting. Do not be discouraged if you start smoking again. Remember, most people try several times before finally quitting. You may have symptoms of withdrawal because your body is used to nicotine. You may crave cigarettes, be irritable, feel very hungry, cough often, get headaches, or have difficulty concentrating. The withdrawal symptoms are only temporary. They are strongest when you first quit, but they will go away within 10-14 days. To reduce the chances of relapse, try to:  Avoid drinking alcohol. Drinking lowers your chances of successfully quitting.  Reduce the  amount of caffeine you consume. Once you quit smoking, the amount of caffeine in your body increases and can give you symptoms, such as a rapid heartbeat, sweating, and anxiety.  Avoid smokers because they can make you want to smoke.  Do not let weight gain distract you. Many smokers will gain weight when they quit, usually less than 10 pounds. Eat a healthy diet and stay active. You can always lose the weight gained after you quit.  Find ways to improve your mood other than smoking. FOR MORE INFORMATION  www.smokefree.gov  Document Released: 03/17/2001 Document Revised: 08/07/2013 Document Reviewed: 07/02/2011 ExitCare Patient Information 2015 [employer]. This information is not intended to replace advice given to you by your health care provider. Make sure you discuss any questions you have with your health   care provider.    Smoking Cessation, Tips for Success If you are ready to quit smoking, congratulations! You have chosen to help yourself be healthier. Cigarettes bring nicotine, tar, carbon monoxide, and other irritants into your body. Your lungs, heart, and blood vessels will be able to work better without these poisons. There are many different ways to quit smoking. Nicotine gum, nicotine patches, a nicotine inhaler, or nicotine nasal spray can help with physical craving. Hypnosis, support groups, and medicines help break the habit of smoking. WHAT THINGS CAN I DO TO MAKE QUITTING EASIER?  Here are some tips to help you quit for good:  Pick a date when you will quit smoking completely. Tell all of your friends and family about your plan to quit on that date.  Do not try to slowly cut down on the number of cigarettes you are smoking. Pick a quit date and quit smoking completely starting on that day.  Throw away all cigarettes.   Clean and remove all ashtrays from your home, work, and car.  On a card, write down your reasons for quitting. Carry the card with you and read it  when you get the urge to smoke.  Cleanse your body of nicotine. Drink enough water and fluids to keep your urine clear or pale yellow. Do this after quitting to flush the nicotine from your body.  Learn to predict your moods. Do not let a bad situation be your excuse to have a cigarette. Some situations in your life might tempt you into wanting a cigarette.  Never have "just one" cigarette. It leads to wanting another and another. Remind yourself of your decision to quit.  Change habits associated with smoking. If you smoked while driving or when feeling stressed, try other activities to replace smoking. Stand up when drinking your coffee. Brush your teeth after eating. Sit in a different chair when you read the paper. Avoid alcohol while trying to quit, and try to drink fewer caffeinated beverages. Alcohol and caffeine may urge you to smoke.  Avoid foods and drinks that can trigger a desire to smoke, such as sugary or spicy foods and alcohol.  Ask people who smoke not to smoke around you.  Have something planned to do right after eating or having a cup of coffee. For example, plan to take a walk or exercise.  Try a relaxation exercise to calm you down and decrease your stress. Remember, you may be tense and nervous for the first 2 weeks after you quit, but this will pass.  Find new activities to keep your hands busy. Play with a pen, coin, or rubber band. Doodle or draw things on paper.  Brush your teeth right after eating. This will help cut down on the craving for the taste of tobacco after meals. You can also try mouthwash.   Use oral substitutes in place of cigarettes. Try using lemon drops, carrots, cinnamon sticks, or chewing gum. Keep them handy so they are available when you have the urge to smoke.  When you have the urge to smoke, try deep breathing.  Designate your home as a nonsmoking area.  If you are a heavy smoker, ask your health care provider about a prescription for  nicotine chewing gum. It can ease your withdrawal from nicotine.  Reward yourself. Set aside the cigarette money you save and buy yourself something nice.  Look for support from others. Join a support group or smoking cessation program. Ask someone at home or at work   to help you with your plan to quit smoking.  Always ask yourself, "Do I need this cigarette or is this just a reflex?" Tell yourself, "Today, I choose not to smoke," or "I do not want to smoke." You are reminding yourself of your decision to quit.  Do not replace cigarette smoking with electronic cigarettes (commonly called e-cigarettes). The safety of e-cigarettes is unknown, and some may contain harmful chemicals.  If you relapse, do not give up! Plan ahead and think about what you will do the next time you get the urge to smoke. HOW WILL I FEEL WHEN I QUIT SMOKING? You may have symptoms of withdrawal because your body is used to nicotine (the addictive substance in cigarettes). You may crave cigarettes, be irritable, feel very hungry, cough often, get headaches, or have difficulty concentrating. The withdrawal symptoms are only temporary. They are strongest when you first quit but will go away within 10-14 days. When withdrawal symptoms occur, stay in control. Think about your reasons for quitting. Remind yourself that these are signs that your body is healing and getting used to being without cigarettes. Remember that withdrawal symptoms are easier to treat than the major diseases that smoking can cause.  Even after the withdrawal is over, expect periodic urges to smoke. However, these cravings are generally short lived and will go away whether you smoke or not. Do not smoke! WHAT RESOURCES ARE AVAILABLE TO HELP ME QUIT SMOKING? Your health care provider can direct you to community resources or hospitals for support, which may include:  Group support.  Education.  Hypnosis.  Therapy. Document Released: 12/20/2003 Document  Revised: 08/07/2013 Document Reviewed: 09/08/2012 ExitCare Patient Information 2015 [employer]. This information is not intended to replace advice given to you by your health care provider. Make sure you discuss any questions you have with your health care provider.  

## 2014-05-30 NOTE — Progress Notes (Signed)
Established Carotid Patient   History of Present Illness  Alison Dominguez is a 64 y.o. female patient of Dr. Myra Gianotti with known history of carotid artery stenosis no history of carotid intervention. She returns today for follow up.  The patient denies any history of TIA or stroke symptoms, specifically the patient denies a history of amaurosis fugax or monocular blindness, denies a history unilateral  of facial drooping, denies a history of hemiplegia, and denies a history of receptive or expressive aphasia.  She has MS.    The patient denies New Medical or Surgical History.  Pt Diabetic: No Pt smoker: smoker  (1 ppd, started at age 86 yrs)  Pt meds include: Statin : Yes ASA: Yes Other anticoagulants/antiplatelets: no   Past Medical History  Diagnosis Date  . TOBACCO ABUSE   . Multiple sclerosis   . CAROTID ARTERY STENOSIS, LEFT   . PVD   . ANXIETY   . DYSLIPIDEMIA     Social History History  Substance Use Topics  . Smoking status: Current Every Day Smoker  . Smokeless tobacco: Never Used     Comment: Single, no children. working Community education officer in [employer]   . Alcohol Use: No    Family History Family History  Problem Relation Age of Onset  . Diabetes Other     Family hx  . Hypertension Sister   . Diabetes Brother     Surgical History Past Surgical History  Procedure Laterality Date  . Tubal ligation  1992  . Left foot surgery      No Known Allergies  Current Outpatient Prescriptions  Medication Sig Dispense Refill  . aspirin 81 MG tablet Take 81 mg by mouth daily.      Marland Kitchen ibuprofen (ADVIL,MOTRIN) 200 MG tablet Take 200 mg by mouth every 6 (six) hours as needed.      Marland Kitchen PARoxetine (PAXIL) 20 MG tablet Take 1 tablet (20 mg total) by mouth every morning. As needed 90 tablet 3  . simvastatin (ZOCOR) 40 MG tablet Take 1 tablet (40 mg total) by mouth at bedtime. 90 tablet 3  . tiZANidine (ZANAFLEX) 4 MG tablet Take 1 tablet (4 mg total) by mouth every 8 (eight) hours  as needed. 90 tablet 3   No current facility-administered medications for this visit.    Review of Systems : See HPI for pertinent positives and negatives.  Physical Examination  Filed Vitals:   05/30/14 1148 05/30/14 1149  BP: 102/66 109/74  Pulse: 85 92  Resp:  16  Height:  5\' 6"  (1.676 m)  Weight:  168 lb (76.204 kg)  SpO2:  100%   Body mass index is 27.13 kg/(m^2).  General: WDWN female in NAD GAIT: slow, deliberate Eyes: PERRLA Pulmonary:  Non-labored, CTAB, Negative  Rales, Negative rhonchi, & Negative wheezing.  Cardiac: regular Rhythm,  Negative detected murmur.  VASCULAR EXAM Carotid Bruits Right Left   Negative Negative    Aorta is not palpable. Radial pulses are 1+ palpable and equal.  LE Pulses Right Left       POPLITEAL  not palpable   not palpable       POSTERIOR TIBIAL   palpable    palpable        DORSALIS PEDIS      ANTERIOR TIBIAL  palpable   palpable     Gastrointestinal: soft, nontender, BS WNL, no r/g,  negative palpated masses.  Musculoskeletal: Negative muscle atrophy/wasting. M/S 4/5 throughout, Extremities without ischemic changes.  Neurologic: A&O X 3; Appropriate Affect, Speech is normal CN 2-12 intact, Pain and light touch intact in extremities, Motor exam as listed above.   Non-Invasive Vascular Imaging CAROTID DUPLEX 05/30/2014   CEREBROVASCULAR DUPLEX EVALUATION    INDICATION: Carotid artery disease     PREVIOUS INTERVENTION(S):     DUPLEX EXAM:     RIGHT  LEFT  Peak Systolic Velocities (cm/s) End Diastolic Velocities (cm/s) Plaque LOCATION Peak Systolic Velocities (cm/s) End Diastolic Velocities (cm/s) Plaque  76 25  CCA PROXIMAL 71 22   63 20  CCA MID 80 31   62 22  CCA DISTAL 58 26   75 21  ECA 84 25   48 10 HT ICA PROXIMAL 111 44 HT  83 30  ICA MID 79 29   63 22  ICA DISTAL 72 21     1.31  ICA / CCA Ratio (PSV) 1.38  Antegrade  Vertebral Flow Antegrade   148 Brachial Systolic Pressure (mmHg) 145  Triphasic  Brachial Artery Waveforms Triphasic     Plaque Morphology:  HM = Homogeneous, HT = Heterogeneous, CP = Calcific Plaque, SP = Smooth Plaque, IP = Irregular Plaque     ADDITIONAL FINDINGS: Difficult to visualize distal segments due to high bifurcation.    IMPRESSION: Right internal carotid artery velocities suggest a <40% stenosis. Left internal carotid artery velocities suggest a 40-59% stenosis.      Compared to the previous exam:  Right internal carotid artery velocities have declined since the last exam on 03/28/2012; the left internal carotid artery category of stenosis is stable.      Assessment: Alison Dominguez is a 64 y.o. female who presents with known history of carotid artery stenosis no history of carotid intervention. Today's carotid Duplex reveals right internal carotid artery velocities suggest a <40% stenosis. Left internal carotid artery velocities suggest a 40-59% stenosis. Right internal carotid artery velocities have declined since the last exam on 03/28/2012; the left internal carotid artery category of stenosis is stable.  Unfortunately she continues to smoke.   Plan:  Follow-up in 1 year with Carotid Duplex.  The patient was counseled re smoking cessation and given several free resources re smoking cessation.   I discussed in depth with the patient the nature of atherosclerosis, and emphasized the importance of maximal medical management including strict control of blood pressure, blood glucose, and lipid levels, obtaining regular exercise, and cessation of smoking.  The patient is aware that without maximal medical management the underlying atherosclerotic disease process will progress, limiting the benefit of any interventions. The patient was given information about stroke prevention and what symptoms should prompt the patient to seek  immediate medical care. Thank you for allowing [place] to participate in this patient's care.  Charisse March, RN, MSN, FNP-C [facility] Office: [phone]  Clinic Physician: Hart Rochester on call  05/30/2014 11:58 AM

## 2014-05-31 NOTE — Addendum Note (Signed)
Addended by: Sharee Pimple on: 05/31/2014 02:01 PM   Modules accepted: Orders

## 2014-06-15 ENCOUNTER — Telehealth: Payer: Self-pay

## 2014-06-18 NOTE — Telephone Encounter (Signed)
Patient forgot flu shot this year, but will call and get apt before 07/05/2014

## 2014-07-24 ENCOUNTER — Ambulatory Visit (INDEPENDENT_AMBULATORY_CARE_PROVIDER_SITE_OTHER): Payer: Medicare Other | Admitting: Internal Medicine

## 2014-07-24 ENCOUNTER — Other Ambulatory Visit (INDEPENDENT_AMBULATORY_CARE_PROVIDER_SITE_OTHER): Payer: Medicare Other

## 2014-07-24 ENCOUNTER — Encounter: Payer: Self-pay | Admitting: Internal Medicine

## 2014-07-24 VITALS — BP 122/78 | HR 79 | Temp 98.0°F | Resp 16 | Ht 66.0 in | Wt 169.0 lb

## 2014-07-24 DIAGNOSIS — F17200 Nicotine dependence, unspecified, uncomplicated: Secondary | ICD-10-CM

## 2014-07-24 DIAGNOSIS — Z23 Encounter for immunization: Secondary | ICD-10-CM | POA: Diagnosis not present

## 2014-07-24 DIAGNOSIS — Z0000 Encounter for general adult medical examination without abnormal findings: Secondary | ICD-10-CM

## 2014-07-24 DIAGNOSIS — I739 Peripheral vascular disease, unspecified: Secondary | ICD-10-CM

## 2014-07-24 DIAGNOSIS — F411 Generalized anxiety disorder: Secondary | ICD-10-CM

## 2014-07-24 LAB — LIPID PANEL
CHOLESTEROL: 136 mg/dL (ref 0–200)
HDL: 63 mg/dL (ref >39.00)
LDL Cholesterol: 61 mg/dL (ref 0–99)
NONHDL: 73
Total CHOL/HDL Ratio: 2
Triglycerides: 62 mg/dL (ref 0.0–149.0)
VLDL: 12.4 mg/dL (ref 0.0–40.0)

## 2014-07-24 LAB — COMPREHENSIVE METABOLIC PANEL
ALBUMIN: 3.7 g/dL (ref 3.5–5.2)
ALT: 10 U/L (ref 0–35)
AST: 13 U/L (ref 0–37)
Alkaline Phosphatase: 56 U/L (ref 39–117)
BUN: 16 mg/dL (ref 6–23)
CALCIUM: 9 mg/dL (ref 8.4–10.5)
CO2: 30 mEq/L (ref 19–32)
Chloride: 105 mEq/L (ref 96–112)
Creatinine, Ser: 0.7 mg/dL (ref 0.40–1.20)
GFR: 89.18 mL/min (ref >60.00)
Glucose, Bld: 95 mg/dL (ref 70–99)
POTASSIUM: 4.4 meq/L (ref 3.5–5.1)
Sodium: 138 mEq/L (ref 135–145)
TOTAL PROTEIN: 6.4 g/dL (ref 6.0–8.3)
Total Bilirubin: 0.5 mg/dL (ref 0.2–1.2)

## 2014-07-24 LAB — CBC
HCT: 44.2 % (ref 36.0–46.0)
HEMOGLOBIN: 15.2 g/dL — AB (ref 12.0–15.0)
MCHC: 34.3 g/dL (ref 30.0–36.0)
MCV: 87.5 fl (ref 78.0–100.0)
Platelets: 238 10*3/uL (ref 150.0–400.0)
RBC: 5.05 Mil/uL (ref 3.87–5.11)
RDW: 13.6 % (ref 11.5–15.5)
WBC: 6.6 10*3/uL (ref 4.0–10.5)

## 2014-07-24 LAB — HEMOGLOBIN A1C: Hgb A1c MFr Bld: 5.6 % (ref 4.6–6.5)

## 2014-07-24 MED ORDER — TIZANIDINE HCL 4 MG PO TABS: 4.0000 mg | ORAL_TABLET | Freq: Three times a day (TID) | ORAL | Status: DC | PRN

## 2014-07-24 MED ORDER — VARENICLINE TARTRATE 0.5 MG X 11 & 1 MG X 42 PO MISC: ORAL | Status: DC

## 2014-07-24 MED ORDER — PAROXETINE HCL 20 MG PO TABS: 20.0000 mg | ORAL_TABLET | ORAL | Status: DC

## 2014-07-24 MED ORDER — SIMVASTATIN 40 MG PO TABS: 40.0000 mg | ORAL_TABLET | Freq: Every day | ORAL | Status: DC

## 2014-07-24 NOTE — Assessment & Plan Note (Signed)
Requests refill of paxil, declines to discuss her problems only states that she is fine.

## 2014-07-24 NOTE — Assessment & Plan Note (Signed)
Declines colonoscopy, overdue for mammogram and does not want to do right now. Checking routine labs, she is up 30 pounds in the last 2 years and talked to her about losing some of that weight. Still smoking. No new complaints. Pneumonia series completed. Shingles done, tetanus up to date (due in 2022). 10 year guide for screening given to her today.

## 2014-07-24 NOTE — Patient Instructions (Addendum)
We have printed off your prescriptions and given you the prescription for the chantix in case you want to try to quit smoking.   We will check the labs today and call you back with the results.   Smoking Cessation Quitting smoking is important to your health and has many advantages. However, it is not always easy to quit since nicotine is a very addictive drug. Oftentimes, people try 3 times or more before being able to quit. This document explains the best ways for you to prepare to quit smoking. Quitting takes hard work and a lot of effort, but you can do it. ADVANTAGES OF QUITTING SMOKING  You will live longer, feel better, and live better.  Your body will feel the impact of quitting smoking almost immediately.  Within 20 minutes, blood pressure decreases. Your pulse returns to its normal level.  After 8 hours, carbon monoxide levels in the blood return to normal. Your oxygen level increases.  After 24 hours, the chance of having a heart attack starts to decrease. Your breath, hair, and body stop smelling like smoke.  After 48 hours, damaged nerve endings begin to recover. Your sense of taste and smell improve.  After 72 hours, the body is virtually free of nicotine. Your bronchial tubes relax and breathing becomes easier.  After 2 to 12 weeks, lungs can hold more air. Exercise becomes easier and circulation improves.  The risk of having a heart attack, stroke, cancer, or lung disease is greatly reduced.  After 1 year, the risk of coronary heart disease is cut in half.  After 5 years, the risk of stroke falls to the same as a nonsmoker.  After 10 years, the risk of lung cancer is cut in half and the risk of other cancers decreases significantly.  After 15 years, the risk of coronary heart disease drops, usually to the level of a nonsmoker.  If you are pregnant, quitting smoking will improve your chances of having a healthy baby.  The people you live with, especially any  children, will be healthier.  You will have extra money to spend on things other than cigarettes. QUESTIONS TO THINK ABOUT BEFORE ATTEMPTING TO QUIT You may want to talk about your answers with your health care provider.  Why do you want to quit?  If you tried to quit in the past, what helped and what did not?  What will be the most difficult situations for you after you quit? How will you plan to handle them?  Who can help you through the tough times? Your family? Friends? A health care provider?  What pleasures do you get from smoking? What ways can you still get pleasure if you quit? Here are some questions to ask your health care provider:  How can you help me to be successful at quitting?  What medicine do you think would be best for me and how should I take it?  What should I do if I need more help?  What is smoking withdrawal like? How can I get information on withdrawal? GET READY  Set a quit date.  Change your environment by getting rid of all cigarettes, ashtrays, matches, and lighters in your home, car, or work. Do not let people smoke in your home.  Review your past attempts to quit. Think about what worked and what did not. GET SUPPORT AND ENCOURAGEMENT You have a better chance of being successful if you have help. You can get support in many ways.  Tell your  family, friends, and coworkers that you are going to quit and need their support. Ask them not to smoke around you.  Get individual, group, or telephone counseling and support. Programs are available at [employer] and health centers. Call your local health department for information about programs in your area.  Spiritual beliefs and practices may help some smokers quit.  Download a "quit meter" on your computer to keep track of quit statistics, such as how long you have gone without smoking, cigarettes not smoked, and money saved.  Get a self-help book about quitting smoking and staying off  tobacco. [place] yourself from urges to smoke. Talk to someone, go for a walk, or occupy your time with a task.  Change your normal routine. Take a different route to work. Drink tea instead of coffee. Eat breakfast in a different place.  Reduce your stress. Take a hot bath, exercise, or read a book.  Plan something enjoyable to do every day. Reward yourself for not smoking.  Explore interactive web-based programs that specialize in helping you quit. GET MEDICINE AND USE IT CORRECTLY Medicines can help you stop smoking and decrease the urge to smoke. Combining medicine with the above behavioral methods and support can greatly increase your chances of successfully quitting smoking.  Nicotine replacement therapy helps deliver nicotine to your body without the negative effects and risks of smoking. Nicotine replacement therapy includes nicotine gum, lozenges, inhalers, nasal sprays, and skin patches. Some may be available over-the-counter and others require a prescription.  Antidepressant medicine helps people abstain from smoking, but how this works is unknown. This medicine is available by prescription.  Nicotinic receptor partial agonist medicine simulates the effect of nicotine in your brain. This medicine is available by prescription. Ask your health care provider for advice about which medicines to use and how to use them based on your health history. Your health care provider will tell you what side effects to look out for if you choose to be on a medicine or therapy. Carefully read the information on the package. Do not use any other product containing nicotine while using a nicotine replacement product.  RELAPSE OR DIFFICULT SITUATIONS Most relapses occur within the first 3 months after quitting. Do not be discouraged if you start smoking again. Remember, most people try several times before finally quitting. You may have symptoms of withdrawal because  your body is used to nicotine. You may crave cigarettes, be irritable, feel very hungry, cough often, get headaches, or have difficulty concentrating. The withdrawal symptoms are only temporary. They are strongest when you first quit, but they will go away within 10-14 days. To reduce the chances of relapse, try to:  Avoid drinking alcohol. Drinking lowers your chances of successfully quitting.  Reduce the amount of caffeine you consume. Once you quit smoking, the amount of caffeine in your body increases and can give you symptoms, such as a rapid heartbeat, sweating, and anxiety.  Avoid smokers because they can make you want to smoke.  Do not let weight gain distract you. Many smokers will gain weight when they quit, usually less than 10 pounds. Eat a healthy diet and stay active. You can always lose the weight gained after you quit.  Find ways to improve your mood other than smoking. FOR MORE INFORMATION  www.smokefree.gov  Document Released: 03/17/2001 Document Revised: 08/07/2013 Document Reviewed: 07/02/2011 [facility] Patient Information 2015 [place], [place]. This information is not intended to replace advice given to you  by your health care provider. Make sure you discuss any questions you have with your health care provider.  Health Maintenance Adopting a healthy lifestyle and getting preventive care can go a long way to promote health and wellness. Talk with your health care provider about what schedule of regular examinations is right for you. This is a good chance for you to check in with your provider about disease prevention and staying healthy. In between checkups, there are plenty of things you can do on your own. Experts have done a lot of research about which lifestyle changes and preventive measures are most likely to keep you healthy. Ask your health care provider for more information. WEIGHT AND DIET  Eat a healthy diet  Be sure to include plenty of vegetables, fruits,  low-fat dairy products, and lean protein.  Do not eat a lot of foods high in solid fats, added sugars, or salt.  Get regular exercise. This is one of the most important things you can do for your health.  Most adults should exercise for at least 150 minutes each week. The exercise should increase your heart rate and make you sweat (moderate-intensity exercise).  Most adults should also do strengthening exercises at least twice a week. This is in addition to the moderate-intensity exercise.  Maintain a healthy weight  Body mass index (BMI) is a measurement that can be used to identify possible weight problems. It estimates body fat based on height and weight. Your health care provider can help determine your BMI and help you achieve or maintain a healthy weight.  For females 2 years of age and older:   A BMI below 18.5 is considered underweight.  A BMI of 18.5 to 24.9 is normal.  A BMI of 25 to 29.9 is considered overweight.  A BMI of 30 and above is considered obese.  Watch levels of cholesterol and blood lipids  You should start having your blood tested for lipids and cholesterol at 65 years of age, then have this test every 5 years.  You may need to have your cholesterol levels checked more often if:  Your lipid or cholesterol levels are high.  You are older than 65 years of age.  You are at high risk for heart disease.  CANCER SCREENING   Lung Cancer  Lung cancer screening is recommended for adults 89-43 years old who are at high risk for lung cancer because of a history of smoking.  A yearly low-dose CT scan of the lungs is recommended for people who:  Currently smoke.  Have quit within the past 15 years.  Have at least a 30-pack-year history of smoking. A pack year is smoking an average of one pack of cigarettes a day for 1 year.  Yearly screening should continue until it has been 15 years since you quit.  Yearly screening should stop if you develop a  health problem that would prevent you from having lung cancer treatment.  Breast Cancer  Practice breast self-awareness. This means understanding how your breasts normally appear and feel.  It also means doing regular breast self-exams. Let your health care provider know about any changes, no matter how small.  If you are in your 20s or 30s, you should have a clinical breast exam (CBE) by a health care provider every 1-3 years as part of a regular health exam.  If you are 38 or older, have a CBE every year. Also consider having a breast X-ray (mammogram) every year.  If you  have a family history of breast cancer, talk to your health care provider about genetic screening.  If you are at high risk for breast cancer, talk to your health care provider about having an MRI and a mammogram every year.  Breast cancer gene (BRCA) assessment is recommended for women who have family members with BRCA-related cancers. BRCA-related cancers include:  Breast.  Ovarian.  Tubal.  Peritoneal cancers.  Results of the assessment will determine the need for genetic counseling and BRCA1 and BRCA2 testing. Cervical Cancer Routine pelvic examinations to screen for cervical cancer are no longer recommended for nonpregnant women who are considered low risk for cancer of the pelvic organs (ovaries, uterus, and vagina) and who do not have symptoms. A pelvic examination may be necessary if you have symptoms including those associated with pelvic infections. Ask your health care provider if a screening pelvic exam is right for you.   The Pap test is the screening test for cervical cancer for women who are considered at risk.  If you had a hysterectomy for a problem that was not cancer or a condition that could lead to cancer, then you no longer need Pap tests.  If you are older than 65 years, and you have had normal Pap tests for the past 10 years, you no longer need to have Pap tests.  If you have had past  treatment for cervical cancer or a condition that could lead to cancer, you need Pap tests and screening for cancer for at least 20 years after your treatment.  If you no longer get a Pap test, assess your risk factors if they change (such as having a new sexual partner). This can affect whether you should start being screened again.  Some women have medical problems that increase their chance of getting cervical cancer. If this is the case for you, your health care provider may recommend more frequent screening and Pap tests.  The human papillomavirus (HPV) test is another test that may be used for cervical cancer screening. The HPV test looks for the virus that can cause cell changes in the cervix. The cells collected during the Pap test can be tested for HPV.  The HPV test can be used to screen women 29 years of age and older. Getting tested for HPV can extend the interval between normal Pap tests from three to five years.  An HPV test also should be used to screen women of any age who have unclear Pap test results.  After 65 years of age, women should have HPV testing as often as Pap tests.  Colorectal Cancer  This type of cancer can be detected and often prevented.  Routine colorectal cancer screening usually begins at 65 years of age and continues through 65 years of age.  Your health care provider may recommend screening at an earlier age if you have risk factors for colon cancer.  Your health care provider may also recommend using home test kits to check for hidden blood in the stool.  A small camera at the end of a tube can be used to examine your colon directly (sigmoidoscopy or colonoscopy). This is done to check for the earliest forms of colorectal cancer.  Routine screening usually begins at age 78.  Direct examination of the colon should be repeated every 5-10 years through 65 years of age. However, you may need to be screened more often if early forms of precancerous polyps  or small growths are found. Skin Cancer  Check  your skin from head to toe regularly.  Tell your health care provider about any new moles or changes in moles, especially if there is a change in a mole's shape or color.  Also tell your health care provider if you have a mole that is larger than the size of a pencil eraser.  Always use sunscreen. Apply sunscreen liberally and repeatedly throughout the day.  Protect yourself by wearing long sleeves, pants, a wide-brimmed hat, and sunglasses whenever you are outside. HEART DISEASE, DIABETES, AND HIGH BLOOD PRESSURE   Have your blood pressure checked at least every 1-2 years. High blood pressure causes heart disease and increases the risk of stroke.  If you are between 36 years and 59 years old, ask your health care provider if you should take aspirin to prevent strokes.  Have regular diabetes screenings. This involves taking a blood sample to check your fasting blood sugar level.  If you are at a normal weight and have a low risk for diabetes, have this test once every three years after 65 years of age.  If you are overweight and have a high risk for diabetes, consider being tested at a younger age or more often. PREVENTING INFECTION  Hepatitis B  If you have a higher risk for hepatitis B, you should be screened for this virus. You are considered at high risk for hepatitis B if:  You were born in a country where hepatitis B is common. Ask your health care provider which countries are considered high risk.  Your parents were born in a high-risk country, and you have not been immunized against hepatitis B (hepatitis B vaccine).  You have HIV or AIDS.  You use needles to inject street drugs.  You live with someone who has hepatitis B.  You have had sex with someone who has hepatitis B.  You get hemodialysis treatment.  You take certain medicines for conditions, including cancer, organ transplantation, and autoimmune  conditions. Hepatitis C  Blood testing is recommended for:  Everyone born from [birth year] through 1966.  Anyone with known risk factors for hepatitis C. Sexually transmitted infections (STIs)  You should be screened for sexually transmitted infections (STIs) including gonorrhea and chlamydia if:  You are sexually active and are younger than 65 years of age.  You are older than 65 years of age and your health care provider tells you that you are at risk for this type of infection.  Your sexual activity has changed since you were last screened and you are at an increased risk for chlamydia or gonorrhea. Ask your health care provider if you are at risk.  If you do not have HIV, but are at risk, it may be recommended that you take a prescription medicine daily to prevent HIV infection. This is called pre-exposure prophylaxis (PrEP). You are considered at risk if:  You are sexually active and do not regularly use condoms or know the HIV status of your partner(s).  You take drugs by injection.  You are sexually active with a partner who has HIV. Talk with your health care provider about whether you are at high risk of being infected with HIV. If you choose to begin PrEP, you should first be tested for HIV. You should then be tested every 3 months for as long as you are taking PrEP.  PREGNANCY   If you are premenopausal and you may become pregnant, ask your health care provider about preconception counseling.  If you may become pregnant, take 400  to 800 micrograms (mcg) of folic acid every day.  If you want to prevent pregnancy, talk to your health care provider about birth control (contraception). OSTEOPOROSIS AND MENOPAUSE   Osteoporosis is a disease in which the bones lose minerals and strength with aging. This can result in serious bone fractures. Your risk for osteoporosis can be identified using a bone density scan.  If you are 74 years of age or older, or if you are at risk for  osteoporosis and fractures, ask your health care provider if you should be screened.  Ask your health care provider whether you should take a calcium or vitamin D supplement to lower your risk for osteoporosis.  Menopause may have certain physical symptoms and risks.  Hormone replacement therapy may reduce some of these symptoms and risks. Talk to your health care provider about whether hormone replacement therapy is right for you.  HOME CARE INSTRUCTIONS   Schedule regular health, dental, and eye exams.  Stay current with your immunizations.   Do not use any tobacco products including cigarettes, chewing tobacco, or electronic cigarettes.  If you are pregnant, do not drink alcohol.  If you are breastfeeding, limit how much and how often you drink alcohol.  Limit alcohol intake to no more than 1 drink per day for nonpregnant women. One drink equals 12 ounces of beer, 5 ounces of wine, or 1 ounces of hard liquor.  Do not use street drugs.  Do not share needles.  Ask your health care provider for help if you need support or information about quitting drugs.  Tell your health care provider if you often feel depressed.  Tell your health care provider if you have ever been abused or do not feel safe at home. Document Released: 10/06/2010 Document Revised: 08/07/2013 Document Reviewed: 02/22/2013 [facility] Patient Information 2015 [place], [place]. This information is not intended to replace advice given to you by your health care provider. Make sure you discuss any questions you have with your health care provider.

## 2014-07-24 NOTE — Assessment & Plan Note (Signed)
Recent follow up with vascular with stable exam. Repeat carotid US in 1 year. Still smoking.

## 2014-07-24 NOTE — Progress Notes (Signed)
Pre visit review using our clinic review tool, if applicable. No additional management support is needed unless otherwise documented below in the visit note. 

## 2014-07-24 NOTE — Progress Notes (Signed)
   Subjective:    Patient ID: Alison Dominguez, female    DOB: September 10, 1950, 64 y.o.   MRN: [id number]  HPI Here for medicare wellness, no new complaints. Please see A/P for status and treatment of chronic medical problems.   Diet: poor Physical activity: sedentary Depression/mood screen: negative Hearing: intact to whispered voice Visual acuity: grossly normal ADLs: capable Fall risk: none Home safety: good Cognitive evaluation: intact to orientation, naming, recall and repetition EOL planning: refused to discuss  I have personally reviewed and have noted 1. The patient's medical and social history - reviewed today no changes 2. Their use of alcohol, tobacco or illicit drugs (still smoking) 3. Their current medications and supplements 4. The patient's functional ability including ADL's, fall risks, home safety risks and hearing or visual impairment. 5. Diet and physical activities 6. Evidence for depression or mood disorders 7. Care team reviewed and updated (available in snapshot)  Review of Systems    Objective:   Physical Exam Filed Vitals:   07/24/14 1103  BP: 122/78  Pulse: 79  Temp: 98 F (36.7 C)  TempSrc: Oral  Resp: 16  Height: 5\' 6"  (1.676 m)  Weight: 169 lb (76.658 kg)  SpO2: 92%      Assessment & Plan:  Prevnar 13 given at today's visit

## 2014-07-24 NOTE — Assessment & Plan Note (Addendum)
Still smoking, no desire to quit right now. Wants rx for chantix in case she wants to quit sometime in the next year. Reminded her about the risks and harms of smoking as well as the benefits of quitting including financial.

## 2014-09-20 ENCOUNTER — Telehealth: Payer: Self-pay | Admitting: *Deleted

## 2014-09-20 DIAGNOSIS — Z1231 Encounter for screening mammogram for malignant neoplasm of breast: Secondary | ICD-10-CM

## 2014-09-20 NOTE — Telephone Encounter (Signed)
I called pt to see when her last mammogram was. She is not sure. EPIC shows last mammogram was 2011. I placed order for mammogram and informed pt she will be receiving another call about the appt date/time.

## 2014-11-13 ENCOUNTER — Encounter: Payer: Self-pay | Admitting: Internal Medicine

## 2014-11-29 ENCOUNTER — Ambulatory Visit: Payer: Medicare Other

## 2015-02-19 ENCOUNTER — Ambulatory Visit (INDEPENDENT_AMBULATORY_CARE_PROVIDER_SITE_OTHER): Payer: Medicare Other

## 2015-02-19 DIAGNOSIS — Z23 Encounter for immunization: Secondary | ICD-10-CM | POA: Diagnosis not present

## 2015-06-03 ENCOUNTER — Encounter (HOSPITAL_COMMUNITY): Payer: Medicare Other

## 2015-06-03 ENCOUNTER — Ambulatory Visit: Payer: Medicare Other | Admitting: Family

## 2016-01-10 ENCOUNTER — Other Ambulatory Visit: Payer: Self-pay | Admitting: Family

## 2016-01-10 DIAGNOSIS — I6523 Occlusion and stenosis of bilateral carotid arteries: Secondary | ICD-10-CM

## 2016-01-16 ENCOUNTER — Ambulatory Visit (INDEPENDENT_AMBULATORY_CARE_PROVIDER_SITE_OTHER): Payer: Medicare Other | Admitting: Podiatry

## 2016-01-16 ENCOUNTER — Encounter: Payer: Self-pay | Admitting: Podiatry

## 2016-01-16 VITALS — BP 115/68 | HR 76 | Resp 16

## 2016-01-16 DIAGNOSIS — L600 Ingrowing nail: Secondary | ICD-10-CM | POA: Diagnosis not present

## 2016-01-16 DIAGNOSIS — M79676 Pain in unspecified toe(s): Secondary | ICD-10-CM

## 2016-01-16 DIAGNOSIS — B351 Tinea unguium: Secondary | ICD-10-CM

## 2016-01-16 NOTE — Progress Notes (Signed)
   Subjective:    Patient ID: Alison SlaterKatie S Dominguez, female    DOB: 04/21/1950, 65 y.o.   MRN: [id number]  HPI: She presents today with a chief complaint of a painfully thick hallux nail left. She states this thickness discolored and there is some tenderness and he seems to be getting worse and she's tried no treatment for.    Review of Systems  Eyes: Positive for itching.  All other systems reviewed and are negative.      Objective:   Physical Exam: Vital signs are stable alert and oriented 3. Pulses are strongly palpable. Neurologic sensorium is intact. Deep tendon reflexes are intact. Muscle strength is normal. She has a severe nail dystrophy with onychocryptosis and onychogryphosis with onycholysis distally. This is affecting both the hallux nail left and right to some degree.        Assessment & Plan:  Onychodystrophy hallux bilateral.  Plan: Debridement of the nails today and thinning of the nails. Follow up with her as needed. Offered her options for nail avulsion and matrixectomy she declined.

## 2016-01-30 ENCOUNTER — Encounter: Payer: Self-pay | Admitting: Family

## 2016-02-03 ENCOUNTER — Ambulatory Visit (INDEPENDENT_AMBULATORY_CARE_PROVIDER_SITE_OTHER): Payer: Medicare Other | Admitting: Family

## 2016-02-03 ENCOUNTER — Encounter: Payer: Self-pay | Admitting: Family

## 2016-02-03 ENCOUNTER — Ambulatory Visit (HOSPITAL_COMMUNITY)
Admission: RE | Admit: 2016-02-03 | Discharge: 2016-02-03 | Disposition: A | Discharge status: Home / Self Care | Payer: Medicare Other | Source: Ambulatory Visit | Attending: Family | Admitting: Family

## 2016-02-03 VITALS — BP 110/73 | HR 77 | Temp 97.8°F | Resp 18 | Ht 66.0 in | Wt 166.7 lb

## 2016-02-03 DIAGNOSIS — I6523 Occlusion and stenosis of bilateral carotid arteries: Secondary | ICD-10-CM | POA: Diagnosis not present

## 2016-02-03 DIAGNOSIS — Z87891 Personal history of nicotine dependence: Secondary | ICD-10-CM | POA: Diagnosis not present

## 2016-02-03 LAB — VAS US CAROTID
LCCADDIAS: 26 cm/s
LCCADSYS: 76 cm/s
LCCAPDIAS: 31 cm/s
LCCAPSYS: 94 cm/s
LEFT VERTEBRAL DIAS: 17 cm/s
LICADDIAS: -29 cm/s
LICADSYS: -72 cm/s
LICAPSYS: -120 cm/s
Left ICA prox dias: -40 cm/s
RCCAPSYS: -103 cm/s
RIGHT CCA MID DIAS: 28 cm/s
RIGHT ECA DIAS: -15 cm/s
RIGHT VERTEBRAL DIAS: -10 cm/s
Right CCA prox dias: -30 cm/s
Right cca dist sys: -57 cm/s

## 2016-02-03 NOTE — Progress Notes (Signed)
Chief Complaint: Follow up Extracranial Carotid Artery Stenosis   History of Present Illness  Alison Dominguez is a 65 y.o. female patient of Dr. Myra Gianotti with known history of carotid artery stenosis no history of carotid artery intervention. She returns today for follow up.  The patient denies any history of TIA or stroke symptoms, specifically she denies a history of amaurosis fugax or monocular blindness, unilateral facial drooping, hemiplegia, or receptive or expressive aphasia.  She has MS, she has occasional dizziness associated with this, her neurologist is Dr. Anne Hahn.    Pt Diabetic: No Pt smoker: smoker  (quit June, 2017, was smoking 1 ppd, started at age 25 yrs)  Pt meds include: Statin : Yes ASA: Yes Other anticoagulants/antiplatelets: no   Past Medical History:  Diagnosis Date  . ANXIETY   . CAROTID ARTERY STENOSIS, LEFT   . DYSLIPIDEMIA   . Multiple sclerosis (HCC)   . PVD   . TOBACCO ABUSE     Social History Social History  Substance Use Topics  . Smoking status: Current Every Day Smoker  . Smokeless tobacco: Never Used     Comment: Single, no children. working Community education officer in [employer]   . Alcohol use No    Family History Family History  Problem Relation Age of Onset  . Diabetes Brother   . Diabetes Other     Family hx  . Hypertension Sister     Surgical History Past Surgical History:  Procedure Laterality Date  . Left foot surgery    . TUBAL LIGATION  1992    No Known Allergies  Current Outpatient Prescriptions  Medication Sig Dispense Refill  . aspirin 81 MG tablet Take 81 mg by mouth daily.      Marland Kitchen ibuprofen (ADVIL,MOTRIN) 200 MG tablet Take 200 mg by mouth every 6 (six) hours as needed.      Marland Kitchen PARoxetine (PAXIL) 20 MG tablet Take 1 tablet (20 mg total) by mouth every morning. As needed 90 tablet 3  . simvastatin (ZOCOR) 40 MG tablet Take 1 tablet (40 mg total) by mouth at bedtime. 365 tablet 0  . tiZANidine (ZANAFLEX) 4 MG tablet Take 1  tablet (4 mg total) by mouth every 8 (eight) hours as needed. 90 tablet 3   No current facility-administered medications for this visit.     Review of Systems : See HPI for pertinent positives and negatives.  Physical Examination  Vitals:   02/03/16 1557 02/03/16 1600  BP: 120/73 110/73  Pulse: 77   Resp: 18   Temp: 97.8 F (36.6 C)   TempSrc: Oral   SpO2: 99%   Weight: 166 lb 11.2 oz (75.6 kg)   Height: 5\' 6"  (1.676 m)    Body mass index is 26.91 kg/m.  General: WDWN female in NAD GAIT: slow, deliberate Eyes: PERRLA Pulmonary: Respirations are non-labored, CTAB, fair air movement in all fields  Cardiac: regular rhythm, no detected murmur.  VASCULAR EXAM Carotid Bruits Right Left   Negative Negative    Aorta is not palpable. Radial pulses are 1+ palpable and equal.  LE Pulses Right Left       POPLITEAL  not palpable  not palpable       POSTERIOR TIBIAL   palpable   palpable       DORSALIS PEDIS      ANTERIOR TIBIAL  palpable  palpable    Gastrointestinal: soft, nontender, BS WNL, no r/g,  no palpated masses.  Musculoskeletal: No muscle atrophy/wasting. M/S 4/5 throughout, Extremities without ischemic changes.  Neurologic: A&O X 3; Appropriate Affect, Speech is normal CN 2-12 intact, Pain and light touch intact in extremities, Motor exam as listed above.    Assessment: Alison Dominguez is a 65 y.o. female who presents with known history of carotid artery stenosis no history of carotid intervention. She has no history of stroke or TIA.  Fortunately she stopped smoking 10/04/15.  She does not have DM. She takes a daily ASA and a statin.  DATA Today's carotid Duplex reveals <40% right internal carotid artery stenosis. Left internal carotid artery with 40-59% stenosis. Bilateral vertebral arteries are  antegrade. Bilateral subclavian arteries are triphasic.  No significant change compared to the last exam on 05/30/14.    Plan:  Gradually increase walking.   Follow-up in 1 year with Carotid Duplex scan.  I discussed in depth with the patient the nature of atherosclerosis, and emphasized the importance of maximal medical management including strict control of blood pressure, blood glucose, and lipid levels, obtaining regular exercise, and continued  cessation of smoking.  The patient is aware that without maximal medical management the underlying atherosclerotic disease process will progress, limiting the benefit of any interventions. The patient was given information about stroke prevention and what symptoms should prompt the patient to seek immediate medical care. Thank you for allowing us to participate in this patient's care.  Charisse MarchSuzanne Reilly Blades, RN, MSN, FNP-C [facility] [place] Office: [phone]  Clinic Physician: Myra GianottiBrabham  02/03/16 4:03 PM

## 2016-02-03 NOTE — Patient Instructions (Addendum)
Stroke Prevention Some medical conditions and behaviors are associated with an increased chance of having a stroke. You may prevent a stroke by making healthy choices and managing medical conditions. HOW CAN I REDUCE MY RISK OF HAVING A STROKE?   Stay physically active. Get at least 30 minutes of activity on most or all days.  Do not smoke. It may also be helpful to avoid exposure to secondhand smoke.  Limit alcohol use. Moderate alcohol use is considered to be:  No more than 2 drinks per day for men.  No more than 1 drink per day for nonpregnant women.  Eat healthy foods. This involves:  Eating 5 or more servings of fruits and vegetables a day.  Making dietary changes that address high blood pressure (hypertension), high cholesterol, diabetes, or obesity.  Manage your cholesterol levels.  Making food choices that are high in fiber and low in saturated fat, trans fat, and cholesterol may control cholesterol levels.  Take any prescribed medicines to control cholesterol as directed by your health care provider.  Manage your diabetes.  Controlling your carbohydrate and sugar intake is recommended to manage diabetes.  Take any prescribed medicines to control diabetes as directed by your health care provider.  Control your hypertension.  Making food choices that are low in salt (sodium), saturated fat, trans fat, and cholesterol is recommended to manage hypertension.  Ask your health care provider if you need treatment to lower your blood pressure. Take any prescribed medicines to control hypertension as directed by your health care provider.  If you are 18-39 years of age, have your blood pressure checked every 3-5 years. If you are 40 years of age or older, have your blood pressure checked every year.  Maintain a healthy weight.  Reducing calorie intake and making food choices that are low in sodium, saturated fat, trans fat, and cholesterol are recommended to manage  weight.  Stop drug abuse.  Avoid taking birth control pills.  Talk to your health care provider about the risks of taking birth control pills if you are over 35 years old, smoke, get migraines, or have ever had a blood clot.  Get evaluated for sleep disorders (sleep apnea).  Talk to your health care provider about getting a sleep evaluation if you snore a lot or have excessive sleepiness.  Take medicines only as directed by your health care provider.  For some people, aspirin or blood thinners (anticoagulants) are helpful in reducing the risk of forming abnormal blood clots that can lead to stroke. If you have the irregular heart rhythm of atrial fibrillation, you should be on a blood thinner unless there is a good reason you cannot take them.  Understand all your medicine instructions.  Make sure that other conditions (such as anemia or atherosclerosis) are addressed. SEEK IMMEDIATE MEDICAL CARE IF:   You have sudden weakness or numbness of the face, arm, or leg, especially on one side of the body.  Your face or eyelid droops to one side.  You have sudden confusion.  You have trouble speaking (aphasia) or understanding.  You have sudden trouble seeing in one or both eyes.  You have sudden trouble walking.  You have dizziness.  You have a loss of balance or coordination.  You have a sudden, severe headache with no known cause.  You have new chest pain or an irregular heartbeat. Any of these symptoms may represent a serious problem that is an emergency. Do not wait to see if the symptoms will   go away. Get medical help at once. Call your local emergency services (911 in [place] Do not drive yourself to the hospital.   This information is not intended to replace advice given to you by your health care provider. Make sure you discuss any questions you have with your health care provider.   Document Released: 04/30/2004 Document Revised: 04/13/2014 Document Reviewed:  09/23/2012 Elsevier Interactive Patient Education 2016 Elsevier Inc.  

## 2016-02-04 ENCOUNTER — Other Ambulatory Visit: Payer: Self-pay | Admitting: *Deleted

## 2016-02-04 DIAGNOSIS — I6523 Occlusion and stenosis of bilateral carotid arteries: Secondary | ICD-10-CM

## 2016-12-15 DIAGNOSIS — Z23 Encounter for immunization: Secondary | ICD-10-CM | POA: Diagnosis not present

## 2016-12-15 DIAGNOSIS — E782 Mixed hyperlipidemia: Secondary | ICD-10-CM | POA: Diagnosis not present

## 2016-12-15 DIAGNOSIS — L309 Dermatitis, unspecified: Secondary | ICD-10-CM | POA: Diagnosis not present

## 2016-12-17 ENCOUNTER — Encounter: Payer: Medicare Other | Admitting: Internal Medicine

## 2017-02-08 ENCOUNTER — Ambulatory Visit: Payer: Medicare Other | Admitting: Family

## 2017-02-08 ENCOUNTER — Encounter: Payer: Self-pay | Admitting: Family

## 2017-02-08 ENCOUNTER — Ambulatory Visit (HOSPITAL_COMMUNITY)
Admission: RE | Admit: 2017-02-08 | Discharge: 2017-02-08 | Disposition: A | Discharge status: Home / Self Care | Payer: Medicare Other | Source: Ambulatory Visit | Attending: Family | Admitting: Family

## 2017-02-08 VITALS — BP 124/75 | HR 80 | Temp 97.0°F | Resp 18 | Ht 66.0 in | Wt 178.0 lb

## 2017-02-08 DIAGNOSIS — I6523 Occlusion and stenosis of bilateral carotid arteries: Secondary | ICD-10-CM

## 2017-02-08 DIAGNOSIS — Z87891 Personal history of nicotine dependence: Secondary | ICD-10-CM | POA: Diagnosis not present

## 2017-02-08 LAB — VAS US CAROTID
LCCADSYS: 69 cm/s
LCCAPDIAS: 16 cm/s
LEFT ECA DIAS: -19 cm/s
LEFT VERTEBRAL DIAS: 12 cm/s
LICADSYS: -64 cm/s
LICAPDIAS: -37 cm/s
Left CCA dist dias: 27 cm/s
Left CCA prox sys: 51 cm/s
Left ICA dist dias: -22 cm/s
Left ICA prox sys: -104 cm/s
RCCAPDIAS: -31 cm/s
RCCAPSYS: -108 cm/s
RIGHT CCA MID DIAS: 19 cm/s
RIGHT ECA DIAS: -19 cm/s
RIGHT VERTEBRAL DIAS: 9 cm/s
Right cca dist sys: -35 cm/s

## 2017-02-08 NOTE — Patient Instructions (Signed)
Stroke Prevention Some medical conditions and behaviors are associated with an increased chance of having a stroke. You may prevent a stroke by making healthy choices and managing medical conditions. How can I reduce my risk of having a stroke?  Stay physically active. Get at least 30 minutes of activity on most or all days.  Do not smoke. It may also be helpful to avoid exposure to secondhand smoke.  Limit alcohol use. Moderate alcohol use is considered to be: ? No more than 2 drinks per day for men. ? No more than 1 drink per day for nonpregnant women.  Eat healthy foods. This involves: ? Eating 5 or more servings of fruits and vegetables a day. ? Making dietary changes that address high blood pressure (hypertension), high cholesterol, diabetes, or obesity.  Manage your cholesterol levels. ? Making food choices that are high in fiber and low in saturated fat, trans fat, and cholesterol may control cholesterol levels. ? Take any prescribed medicines to control cholesterol as directed by your health care provider.  Manage your diabetes. ? Controlling your carbohydrate and sugar intake is recommended to manage diabetes. ? Take any prescribed medicines to control diabetes as directed by your health care provider.  Control your hypertension. ? Making food choices that are low in salt (sodium), saturated fat, trans fat, and cholesterol is recommended to manage hypertension. ? Ask your health care provider if you need treatment to lower your blood pressure. Take any prescribed medicines to control hypertension as directed by your health care provider. ? If you are 18-39 years of age, have your blood pressure checked every 3-5 years. If you are 40 years of age or older, have your blood pressure checked every year.  Maintain a healthy weight. ? Reducing calorie intake and making food choices that are low in sodium, saturated fat, trans fat, and cholesterol are recommended to manage  weight.  Stop drug abuse.  Avoid taking birth control pills. ? Talk to your health care provider about the risks of taking birth control pills if you are over 35 years old, smoke, get migraines, or have ever had a blood clot.  Get evaluated for sleep disorders (sleep apnea). ? Talk to your health care provider about getting a sleep evaluation if you snore a lot or have excessive sleepiness.  Take medicines only as directed by your health care provider. ? For some people, aspirin or blood thinners (anticoagulants) are helpful in reducing the risk of forming abnormal blood clots that can lead to stroke. If you have the irregular heart rhythm of atrial fibrillation, you should be on a blood thinner unless there is a good reason you cannot take them. ? Understand all your medicine instructions.  Make sure that other conditions (such as anemia or atherosclerosis) are addressed. Get help right away if:  You have sudden weakness or numbness of the face, arm, or leg, especially on one side of the body.  Your face or eyelid droops to one side.  You have sudden confusion.  You have trouble speaking (aphasia) or understanding.  You have sudden trouble seeing in one or both eyes.  You have sudden trouble walking.  You have dizziness.  You have a loss of balance or coordination.  You have a sudden, severe headache with no known cause.  You have new chest pain or an irregular heartbeat. Any of these symptoms may represent a serious problem that is an emergency. Do not wait to see if the symptoms will go away.   Get medical help at once. Call your local emergency services (911 in [place] Do not drive yourself to the hospital. This information is not intended to replace advice given to you by your health care provider. Make sure you discuss any questions you have with your health care provider. Document Released: 04/30/2004 Document Revised: 08/29/2015 Document Reviewed: 09/23/2012 Elsevier  Interactive Patient Education  2017 Elsevier Inc.     Preventing Cerebrovascular Disease Arteries are blood vessels that carry blood that contains oxygen from the heart to all parts of the body. Cerebrovascular disease affects arteries that supply the brain. Any condition that blocks or disrupts blood flow to the brain can cause cerebrovascular disease. Brain cells that lose blood supply start to die within minutes (stroke). Stroke is the main danger of cerebrovascular disease. Atherosclerosis and high blood pressure are common causes of cerebrovascular disease. Atherosclerosis is narrowing and hardening of an artery that results when fat, cholesterol, calcium, or other substances (plaque) build up inside an artery. Plaque reduces blood flow through the artery. High blood pressure increases the risk of bleeding inside the brain. Making diet and lifestyle changes to prevent atherosclerosis and high blood pressure lowers your risk of cerebrovascular disease. What nutrition changes can be made?  Eat more fruits, vegetables, and whole grains.  Reduce how much saturated fat you eat. To do this, eat less red meat and fewer full-fat dairy products.  Eat healthy proteins instead of red meat. Healthy proteins include: ? Fish. Eat fish that contains heart-healthy omega-3 fatty acids, twice a week. Examples include salmon, albacore tuna, mackerel, and herring. ? Chicken. ? Nuts. ? Low-fat or nonfat yogurt.  Avoid processed meats, like bacon and lunchmeat.  Avoid foods that contain: ? A lot of sugar, such as sweets and drinks with added sugar. ? A lot of salt (sodium). Avoid adding extra salt to your food, as told by your health care provider. ? Trans fats, such as margarine and baked goods. Trans fats may be listed as "partially hydrogenated oils" on food labels.  Check food labels to see how much sodium, sugar, and trans fats are in foods.  Use vegetable oils that contain low amounts of  saturated fat, such as olive oil or canola oil. What lifestyle changes can be made?  Drink alcohol in moderation. This means no more than 1 drink a day for nonpregnant women and 2 drinks a day for men. One drink equals 12 oz of beer, 5 oz of wine, or 1 oz of hard liquor.  If you are overweight, ask your health care provider to recommend a weight-loss plan for you. Losing 5-10 lb (2.2-4.5 kg) can reduce your risk of diabetes, atherosclerosis, and high blood pressure.  Exercise for 30?60 minutes on most days, or as much as told by your health care provider. ? Do moderate-intensity exercise, such as brisk walking, bicycling, and water aerobics. Ask your health care provider which activities are safe for you.  Do not use any products that contain nicotine or tobacco, such as cigarettes and e-cigarettes. If you need help quitting, ask your health care provider. Why are these changes important? Making these changes lowers your risk of many diseases that can cause cerebrovascular disease and stroke. Stroke is a leading cause of death and disability. Making these changes also improves your overall health and quality of life. What can I do to lower my risk? The following factors make you more likely to develop cerebrovascular disease:  Being overweight.  Smoking.  Being physically inactive.    Eating a high-fat diet.  Having certain health conditions, such as: ? Diabetes. ? High blood pressure. ? Heart disease. ? Atherosclerosis. ? High cholesterol. ? Sickle cell disease.  Talk with your health care provider about your risk for cerebrovascular disease. Work with your health care provider to control diseases that you have that may contribute to cerebrovascular disease. Your health care provider may prescribe medicines to help prevent major causes of cerebrovascular disease. Where to find more information: Learn more about preventing cerebrovascular disease from:  National Heart, Lung, and  Blood Institute: www.nhlbi.nih.gov/health/health-topics/topics/stroke  Centers for Disease Control and Prevention: cdc.gov/stroke/about.htm  Summary  Cerebrovascular disease can lead to a stroke.  Atherosclerosis and high blood pressure are major causes of cerebrovascular disease.  Making diet and lifestyle changes can reduce your risk of cerebrovascular disease.  Work with your health care provider to get your risk factors under control to reduce your risk of cerebrovascular disease. This information is not intended to replace advice given to you by your health care provider. Make sure you discuss any questions you have with your health care provider. Document Released: 04/07/2015 Document Revised: 10/11/2015 Document Reviewed: 04/07/2015 Elsevier Interactive Patient Education  2018 Elsevier Inc.  

## 2017-02-08 NOTE — Progress Notes (Signed)
Chief Complaint: Follow up Extracranial Carotid Artery Stenosis   History of Present Illness  Alison Dominguez is a 66 y.o. female whom Dr. Myra Gianotti as been monitoring with known history of carotid artery stenosis, no history of carotid artery intervention. She returns today for follow up.  The patient denies any history of TIA or stroke symptoms, specifically she denies a history of amaurosis fugax or monocular blindness, unilateral facial drooping, hemiplegia, or receptive or expressive aphasia.  She has MS, she has occasional dizziness associated with this, her neurologist is Dr. Anne Hahn.   Pt Diabetic: No Pt smoker: smoker (quit June, 2017, was smoking 1 ppd, started at age 10 yrs)  Pt meds include: Statin : Yes ASA: Yes Other anticoagulants/antiplatelets: no   Past Medical History:  Diagnosis Date  . ANXIETY   . CAROTID ARTERY STENOSIS, LEFT   . DYSLIPIDEMIA   . Multiple sclerosis (HCC)   . PVD   . TOBACCO ABUSE     Social History Social History   Tobacco Use  . Smoking status: Former Games developer  . Smokeless tobacco: Never Used  . Tobacco comment: Single, no children. 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 Alcohol use: No  . Drug use: No    Family History Family History  Problem Relation Age of Onset  . Diabetes Brother   . Diabetes Other        Family hx  . Hypertension Sister     Surgical History Past Surgical History:  Procedure Laterality Date  . Left foot surgery    . TUBAL LIGATION  1992    No Known Allergies  Current Outpatient Medications  Medication Sig Dispense Refill  . aspirin 81 MG tablet Take 81 mg by mouth daily.      Marland Kitchen augmented betamethasone dipropionate (DIPROLENE-AF) 0.05 % ointment APPLY TO AFFECTED AREA ON FEET AT BEDTIME  0  . clotrimazole-betamethasone (LOTRISONE) cream     . ibuprofen (ADVIL,MOTRIN) 200 MG tablet Take 200 mg by mouth every 6 (six) hours as needed.      Marland Kitchen PARoxetine (PAXIL) 20 MG tablet  Take 1 tablet (20 mg total) by mouth every morning. As needed 90 tablet 3  . simvastatin (ZOCOR) 40 MG tablet Take 1 tablet (40 mg total) by mouth at bedtime. 365 tablet 0  . tiZANidine (ZANAFLEX) 4 MG tablet Take 1 tablet (4 mg total) by mouth every 8 (eight) hours as needed. 90 tablet 3   No current facility-administered medications for this visit.     Review of Systems : See HPI for pertinent positives and negatives.  Physical Examination  Vitals:   02/08/17 1044 02/08/17 1046  BP: 127/77 124/75  Pulse: 80   Resp: 18   Temp: (!) 97 F (36.1 C)   TempSrc: Oral   SpO2: 96%   Weight: 178 lb (80.7 kg)   Height: 5\' 6"  (1.676 m)    Body mass index is 28.73 kg/m.  General: WDWN female in NAD GAIT:slow, deliberate Eyes: PERRLA Pulmonary: Respirations are non-labored, CTAB, fair air movement in all fields  Cardiac: regular rhythm, no detected murmur.  VASCULAR EXAM Carotid Bruits Right Left   Negative Negative   Abdominal aortic pulse is not palpable. Radial pulses are 1+ palpable and equal.   LE Pulses Right Left  POPLITEAL not palpable not palpable  POSTERIOR TIBIAL palpable palpable  DORSALIS PEDIS ANTERIOR TIBIAL palpable palpable    Gastrointestinal: soft, nontender, BS WNL, no r/g, nopalpated masses.  Musculoskeletal:  No muscle atrophy/wasting. M/S 4/5 throughout, Extremities without ischemic changes.  Neurologic: A&O X 3; appropriate affect, speech is normal, CN 2-12 intact, Pain and light touch intact in extremities, Motor exam as listed above.     Assessment: Alison Dominguez is a 66 y.o. female who presents withknown history of carotid artery stenosis no history of carotid intervention. She has no history of stroke or TIA.  Fortunately she stopped smoking 10/04/15.  She does not have DM. She takes a daily ASA and a statin.  DATA Carotid Duplex (02/08/17): <40% right internal carotid  artery stenosis. Left internal carotid artery with 40-59% stenosis. Bilateral vertebral arteries are antegrade. Bilateral subclavian arteries are triphasic.  No significant change compared to the exams on 05/30/14 and 02/03/16.    Plan:  Gradually increase walking.    Follow-up in 1 year with Carotid Duplex scan.  I discussed in depth with the patient the nature of atherosclerosis, and emphasized the importance of maximal medical management including strict control of blood pressure, blood glucose, and lipid levels, obtaining regular exercise, and continued cessation of smoking.  The patient is aware that without maximal medical management the underlying atherosclerotic disease process will progress, limiting the benefit of any interventions. The patient was given information about stroke prevention and what symptoms should prompt the patient to seek immediate medical care. Thank you for allowing us to participate in this patient's care.  Charisse MarchSuzanne Keitha Kolk, RN, MSN, FNP-C [facility] [place] Office: [phone]  Clinic Physician: Myra GianottiBrabham  02/08/17 10:55 AM

## 2017-02-16 NOTE — Addendum Note (Signed)
Addended by: Tarrah Furuta A on: 02/16/2017 03:58 PM   Modules accepted: Orders  

## 2017-05-24 DIAGNOSIS — L400 Psoriasis vulgaris: Secondary | ICD-10-CM | POA: Diagnosis not present

## 2017-05-24 DIAGNOSIS — L821 Other seborrheic keratosis: Secondary | ICD-10-CM | POA: Diagnosis not present

## 2017-05-24 DIAGNOSIS — Z79899 Other long term (current) drug therapy: Secondary | ICD-10-CM | POA: Diagnosis not present

## 2017-06-22 ENCOUNTER — Other Ambulatory Visit (INDEPENDENT_AMBULATORY_CARE_PROVIDER_SITE_OTHER): Payer: Medicare Other

## 2017-06-22 ENCOUNTER — Encounter: Payer: Self-pay | Admitting: Internal Medicine

## 2017-06-22 ENCOUNTER — Ambulatory Visit (INDEPENDENT_AMBULATORY_CARE_PROVIDER_SITE_OTHER)
Admission: RE | Admit: 2017-06-22 | Discharge: 2017-06-22 | Disposition: A | Discharge status: Home / Self Care | Payer: Medicare Other | Source: Ambulatory Visit | Attending: Internal Medicine | Admitting: Internal Medicine

## 2017-06-22 ENCOUNTER — Ambulatory Visit (INDEPENDENT_AMBULATORY_CARE_PROVIDER_SITE_OTHER): Payer: Medicare Other | Admitting: Internal Medicine

## 2017-06-22 VITALS — BP 134/90 | HR 80 | Temp 97.5°F | Ht 66.0 in | Wt 185.0 lb

## 2017-06-22 DIAGNOSIS — M62838 Other muscle spasm: Secondary | ICD-10-CM | POA: Insufficient documentation

## 2017-06-22 DIAGNOSIS — R7301 Impaired fasting glucose: Secondary | ICD-10-CM

## 2017-06-22 DIAGNOSIS — E785 Hyperlipidemia, unspecified: Secondary | ICD-10-CM

## 2017-06-22 DIAGNOSIS — E2839 Other primary ovarian failure: Secondary | ICD-10-CM | POA: Diagnosis not present

## 2017-06-22 DIAGNOSIS — F411 Generalized anxiety disorder: Secondary | ICD-10-CM | POA: Diagnosis not present

## 2017-06-22 DIAGNOSIS — R1013 Epigastric pain: Secondary | ICD-10-CM

## 2017-06-22 DIAGNOSIS — R109 Unspecified abdominal pain: Secondary | ICD-10-CM | POA: Insufficient documentation

## 2017-06-22 LAB — CBC
HCT: 40.9 % (ref 36.0–46.0)
Hemoglobin: 13.9 g/dL (ref 12.0–15.0)
MCHC: 34.1 g/dL (ref 30.0–36.0)
MCV: 91 fl (ref 78.0–100.0)
Platelets: 223 10*3/uL (ref 150.0–400.0)
RBC: 4.49 Mil/uL (ref 3.87–5.11)
RDW: 14.9 % (ref 11.5–15.5)
WBC: 5.7 10*3/uL (ref 4.0–10.5)

## 2017-06-22 LAB — LIPID PANEL
CHOL/HDL RATIO: 4
CHOLESTEROL: 208 mg/dL — AB (ref 0–200)
HDL: 58.8 mg/dL (ref >39.00)
LDL CALC: 132 mg/dL — AB (ref 0–99)
NonHDL: 149.31
Triglycerides: 87 mg/dL (ref 0.0–149.0)
VLDL: 17.4 mg/dL (ref 0.0–40.0)

## 2017-06-22 LAB — COMPREHENSIVE METABOLIC PANEL
ALBUMIN: 3.8 g/dL (ref 3.5–5.2)
ALT: 13 U/L (ref 0–35)
AST: 11 U/L (ref 0–37)
Alkaline Phosphatase: 54 U/L (ref 39–117)
BUN: 20 mg/dL (ref 6–23)
CHLORIDE: 103 meq/L (ref 96–112)
CO2: 30 meq/L (ref 19–32)
CREATININE: 0.68 mg/dL (ref 0.40–1.20)
Calcium: 9.2 mg/dL (ref 8.4–10.5)
GFR: 91.4 mL/min (ref >60.00)
Glucose, Bld: 103 mg/dL — ABNORMAL HIGH (ref 70–99)
Potassium: 4.6 mEq/L (ref 3.5–5.1)
Sodium: 140 mEq/L (ref 135–145)
Total Bilirubin: 0.6 mg/dL (ref 0.2–1.2)
Total Protein: 6.6 g/dL (ref 6.0–8.3)

## 2017-06-22 LAB — HEMOGLOBIN A1C: Hgb A1c MFr Bld: 5.5 % (ref 4.6–6.5)

## 2017-06-22 MED ORDER — SIMVASTATIN 40 MG PO TABS: 40.0000 mg | ORAL_TABLET | Freq: Every day | ORAL | 1 refills | Status: DC

## 2017-06-22 MED ORDER — TIZANIDINE HCL 4 MG PO TABS: 4.0000 mg | ORAL_TABLET | Freq: Every day | ORAL | 1 refills | Status: DC | PRN

## 2017-06-22 MED ORDER — PANTOPRAZOLE SODIUM 40 MG PO TBEC: 40.0000 mg | DELAYED_RELEASE_TABLET | Freq: Every day | ORAL | 1 refills | Status: DC

## 2017-06-22 NOTE — Progress Notes (Signed)
   Subjective:    Patient ID: Alison Dominguez, female    DOB: [birth year], 67 y.o.   MRN: [id number]  HPI The patient is a 67 YO female coming in for ongoing medical care after 3 year hiatus. She had been seeing another doctor since that time but felt that they were "terrible" and feels that although this office just "wants to take as much money as possible" and doesn't care for people and all doctors just "work to make money and patients do not matter" she has returned for care. She is having new problem of pain with swallowing certain foods. They seem to hurt when they reach her stomach. She has tried swallowing again to see if this helps and it does not. She has not tried swallowing water to see if this would help. Denies overt reflux or burning with eating. Water never causes this problem. She is also belching more in the last 3-4 months. Denies weight change. Has gained weight over the last several years.  She is also having problems with ongoing ankle and calf muscle spasms and takes tizanidine for this. She is using it several times per week usually. Not more than once per day. She originally was prescribed this for headaches but noticed that it worked for her muscle spasms. She denies knowing a cause for the muscle spasms or trigger. She is not exercising currently.   PMH, [facility], social history reviewed and updated  Review of Systems  Constitutional: Negative.   HENT: Negative.   Eyes: Negative.   Respiratory: Negative for cough, chest tightness and shortness of breath.   Cardiovascular: Negative for chest pain, palpitations and leg swelling.  Gastrointestinal: Positive for abdominal pain. Negative for abdominal distention, blood in stool, constipation, diarrhea, nausea and vomiting.       Belching  Musculoskeletal: Negative.   Skin: Negative.   Neurological: Negative.   Psychiatric/Behavioral: Negative.       Objective:   Physical Exam  Constitutional: She is oriented to person, place,  and time. She appears well-developed and well-nourished.  Overweight  HENT:  Head: Normocephalic and atraumatic.  Eyes: EOM are normal.  Neck: Normal range of motion.  Cardiovascular: Normal rate and regular rhythm.  Pulmonary/Chest: Effort normal and breath sounds normal. No respiratory distress. She has no wheezes. She has no rales.  Abdominal: Soft. Bowel sounds are normal. She exhibits no distension. There is no tenderness. There is no rebound.  Musculoskeletal: She exhibits no edema.  Neurological: She is alert and oriented to person, place, and time. Coordination normal.  Skin: Skin is warm and dry.  Psychiatric: She has a normal mood and affect.  Slightly hostile during the visit about her care and about any requests for her health information   Vitals:   06/22/17 1002  BP: 134/90  Pulse: 80  Temp: (!) 97.5 F (36.4 C)  TempSrc: Oral  Weight: 185 lb (83.9 kg)  Height: 5\' 6"  (1.676 m)      Assessment & Plan:

## 2017-06-22 NOTE — Assessment & Plan Note (Signed)
Refill tizanidine daily prn for muscle spasm. She is not aware of triggers. Checking CMP and CBC for any abnormalities.

## 2017-06-22 NOTE — Patient Instructions (Addendum)
We are going to treat the stomach pain with protonix to take daily for 2 weeks. Take it before breakfast daily for 2 weeks and call and let us know if it is working.   If this helps take it for 1-2 months then stop.   We are checking the labs today and will get the mammogram and bone density ordered. [facility]

## 2017-06-22 NOTE — Assessment & Plan Note (Signed)
Needs ongoing care for this and lipid panel ordered. Refill of simvastatin 40 mg daily and adjust as needed. She demands paper script today although I prefer to wait for labs in case changes needed.

## 2017-06-22 NOTE — Assessment & Plan Note (Signed)
Suspect from GERD, less likely stricture. Will rx protonix daily for 1-2 months to see if this helps. Is she is still having the same symptoms may need EGD.

## 2017-06-22 NOTE — Assessment & Plan Note (Signed)
She did not ask for refills of paxil so I can only assume she has another mental health provider since she did not want to answer any questions about her health.

## 2017-06-30 DIAGNOSIS — Z961 Presence of intraocular lens: Secondary | ICD-10-CM | POA: Diagnosis not present

## 2017-07-05 DIAGNOSIS — L400 Psoriasis vulgaris: Secondary | ICD-10-CM | POA: Diagnosis not present

## 2017-07-05 DIAGNOSIS — Z79899 Other long term (current) drug therapy: Secondary | ICD-10-CM | POA: Diagnosis not present

## 2017-10-04 DIAGNOSIS — L400 Psoriasis vulgaris: Secondary | ICD-10-CM | POA: Diagnosis not present

## 2017-10-04 DIAGNOSIS — Z79899 Other long term (current) drug therapy: Secondary | ICD-10-CM | POA: Diagnosis not present

## 2017-12-27 ENCOUNTER — Ambulatory Visit: Payer: Medicare Other | Admitting: Internal Medicine

## 2018-01-04 ENCOUNTER — Encounter: Payer: Self-pay | Admitting: Internal Medicine

## 2018-01-04 ENCOUNTER — Ambulatory Visit (INDEPENDENT_AMBULATORY_CARE_PROVIDER_SITE_OTHER): Payer: Medicare Other | Admitting: Internal Medicine

## 2018-01-04 VITALS — BP 118/78 | HR 70 | Temp 98.1°F | Ht 66.0 in | Wt 177.0 lb

## 2018-01-04 DIAGNOSIS — M62838 Other muscle spasm: Secondary | ICD-10-CM | POA: Diagnosis not present

## 2018-01-04 DIAGNOSIS — Z23 Encounter for immunization: Secondary | ICD-10-CM | POA: Diagnosis not present

## 2018-01-04 DIAGNOSIS — E785 Hyperlipidemia, unspecified: Secondary | ICD-10-CM

## 2018-01-04 DIAGNOSIS — Z0000 Encounter for general adult medical examination without abnormal findings: Secondary | ICD-10-CM

## 2018-01-04 MED ORDER — TIZANIDINE HCL 4 MG PO TABS: 4.0000 mg | ORAL_TABLET | Freq: Every day | ORAL | 6 refills | Status: DC | PRN

## 2018-01-04 MED ORDER — SIMVASTATIN 40 MG PO TABS: 40.0000 mg | ORAL_TABLET | Freq: Every day | ORAL | 3 refills | Status: DC

## 2018-01-04 NOTE — Progress Notes (Signed)
   Subjective:    Patient ID: Alison Dominguez, female    DOB: 04/11/1950, 67 y.o.   MRN: [id number]  HPI Here for medicare wellness and physical, no new complaints. Please see A/P for status and treatment of chronic medical problems.   Diet: heart healthy Physical activity: sedentary Depression/mood screen: negative Hearing: intact to whispered voice Visual acuity: grossly normal with lens, performs annual eye exam  ADLs: capable Fall risk: none Home safety: good Cognitive evaluation: intact to orientation, naming, recall and repetition EOL planning: adv directives discussed  I have personally reviewed and have noted 1. The patient's medical and social history - reviewed today no changes 2. Their use of alcohol, tobacco or illicit drugs 3. Their current medications and supplements 4. The patient's functional ability including ADL's, fall risks, home safety risks and hearing or visual impairment. 5. Diet and physical activities 6. Evidence for depression or mood disorders 7. Care team reviewed and updated (available in snapshot)  Review of Systems  Constitutional: Negative.   HENT: Negative.   Eyes: Negative.   Respiratory: Negative for cough, chest tightness and shortness of breath.   Cardiovascular: Negative for chest pain, palpitations and leg swelling.  Gastrointestinal: Negative for abdominal distention, abdominal pain, constipation, diarrhea, nausea and vomiting.  Musculoskeletal: Negative.   Skin: Negative.   Neurological: Negative.   Psychiatric/Behavioral: Negative.       Objective:   Physical Exam  Constitutional: She is oriented to person, place, and time. She appears well-developed and well-nourished.  HENT:  Head: Normocephalic and atraumatic.  Eyes: EOM are normal.  Neck: Normal range of motion.  Cardiovascular: Normal rate and regular rhythm.  Pulmonary/Chest: Effort normal and breath sounds normal. No respiratory distress. She has no wheezes. She has no  rales.  Abdominal: Soft. Bowel sounds are normal. She exhibits no distension. There is no tenderness. There is no rebound.  Musculoskeletal: She exhibits no edema.  Neurological: She is alert and oriented to person, place, and time. Coordination normal.  Skin: Skin is warm and dry.  Psychiatric: She has a normal mood and affect.   Vitals:   01/04/18 1016  BP: 118/78  Pulse: 70  Temp: 98.1 F (36.7 C)  TempSrc: Oral  SpO2: 98%  Weight: 177 lb (80.3 kg)  Height: 5\' 6"  (1.676 m)      Assessment & Plan:  Flu shot given at visit

## 2018-01-04 NOTE — Patient Instructions (Signed)
We have done the letter for jury duty for you. You do not need labs today.   Come back in about 1 year.   Health Maintenance, Female Adopting a healthy lifestyle and getting preventive care can go a long way to promote health and wellness. Talk with your health care provider about what schedule of regular examinations is right for you. This is a good chance for you to check in with your provider about disease prevention and staying healthy. In between checkups, there are plenty of things you can do on your own. Experts have done a lot of research about which lifestyle changes and preventive measures are most likely to keep you healthy. Ask your health care provider for more information. Weight and diet Eat a healthy diet  Be sure to include plenty of vegetables, fruits, low-fat dairy products, and lean protein.  Do not eat a lot of foods high in solid fats, added sugars, or salt.  Get regular exercise. This is one of the most important things you can do for your health. ? Most adults should exercise for at least 150 minutes each week. The exercise should increase your heart rate and make you sweat (moderate-intensity exercise). ? Most adults should also do strengthening exercises at least twice a week. This is in addition to the moderate-intensity exercise.  Maintain a healthy weight  Body mass index (BMI) is a measurement that can be used to identify possible weight problems. It estimates body fat based on height and weight. Your health care provider can help determine your BMI and help you achieve or maintain a healthy weight.  For females 64 years of age and older: ? A BMI below 18.5 is considered underweight. ? A BMI of 18.5 to 24.9 is normal. ? A BMI of 25 to 29.9 is considered overweight. ? A BMI of 30 and above is considered obese.  Watch levels of cholesterol and blood lipids  You should start having your blood tested for lipids and cholesterol at 68 years of age, then have this  test every 5 years.  You may need to have your cholesterol levels checked more often if: ? Your lipid or cholesterol levels are high. ? You are older than 68 years of age. ? You are at high risk for heart disease.  Cancer screening Lung Cancer  Lung cancer screening is recommended for adults 53-6 years old who are at high risk for lung cancer because of a history of smoking.  A yearly low-dose CT scan of the lungs is recommended for people who: ? Currently smoke. ? Have quit within the past 15 years. ? Have at least a 30-pack-year history of smoking. A pack year is smoking an average of one pack of cigarettes a day for 1 year.  Yearly screening should continue until it has been 15 years since you quit.  Yearly screening should stop if you develop a health problem that would prevent you from having lung cancer treatment.  Breast Cancer  Practice breast self-awareness. This means understanding how your breasts normally appear and feel.  It also means doing regular breast self-exams. Let your health care provider know about any changes, no matter how small.  If you are in your 20s or 30s, you should have a clinical breast exam (CBE) by a health care provider every 1-3 years as part of a regular health exam.  If you are 45 or older, have a CBE every year. Also consider having a breast X-ray (mammogram) every year.  If you have a family history of breast cancer, talk to your health care provider about genetic screening.  If you are at high risk for breast cancer, talk to your health care provider about having an MRI and a mammogram every year.  Breast cancer gene (BRCA) assessment is recommended for women who have family members with BRCA-related cancers. BRCA-related cancers include: ? Breast. ? Ovarian. ? Tubal. ? Peritoneal cancers.  Results of the assessment will determine the need for genetic counseling and BRCA1 and BRCA2 testing.  Cervical Cancer Your health care  provider may recommend that you be screened regularly for cancer of the pelvic organs (ovaries, uterus, and vagina). This screening involves a pelvic examination, including checking for microscopic changes to the surface of your cervix (Pap test). You may be encouraged to have this screening done every 3 years, beginning at age 8.  For women ages 4-65, health care providers may recommend pelvic exams and Pap testing every 3 years, or they may recommend the Pap and pelvic exam, combined with testing for human papilloma virus (HPV), every 5 years. Some types of HPV increase your risk of cervical cancer. Testing for HPV may also be done on women of any age with unclear Pap test results.  Other health care providers may not recommend any screening for nonpregnant women who are considered low risk for pelvic cancer and who do not have symptoms. Ask your health care provider if a screening pelvic exam is right for you.  If you have had past treatment for cervical cancer or a condition that could lead to cancer, you need Pap tests and screening for cancer for at least 20 years after your treatment. If Pap tests have been discontinued, your risk factors (such as having a new sexual partner) need to be reassessed to determine if screening should resume. Some women have medical problems that increase the chance of getting cervical cancer. In these cases, your health care provider may recommend more frequent screening and Pap tests.  Colorectal Cancer  This type of cancer can be detected and often prevented.  Routine colorectal cancer screening usually begins at 68 years of age and continues through 68 years of age.  Your health care provider may recommend screening at an earlier age if you have risk factors for colon cancer.  Your health care provider may also recommend using home test kits to check for hidden blood in the stool.  A small camera at the end of a tube can be used to examine your colon  directly (sigmoidoscopy or colonoscopy). This is done to check for the earliest forms of colorectal cancer.  Routine screening usually begins at age 60.  Direct examination of the colon should be repeated every 5-10 years through 68 years of age. However, you may need to be screened more often if early forms of precancerous polyps or small growths are found.  Skin Cancer  Check your skin from head to toe regularly.  Tell your health care provider about any new moles or changes in moles, especially if there is a change in a mole's shape or color.  Also tell your health care provider if you have a mole that is larger than the size of a pencil eraser.  Always use sunscreen. Apply sunscreen liberally and repeatedly throughout the day.  Protect yourself by wearing long sleeves, pants, a wide-brimmed hat, and sunglasses whenever you are outside.  Heart disease, diabetes, and high blood pressure  High blood pressure causes heart disease and  increases the risk of stroke. High blood pressure is more likely to develop in: ? People who have blood pressure in the high end of the normal range (130-139/85-89 mm Hg). ? People who are overweight or obese. ? People who are African American.  If you are 34-35 years of age, have your blood pressure checked every 3-5 years. If you are 32 years of age or older, have your blood pressure checked every year. You should have your blood pressure measured twice-once when you are at a hospital or clinic, and once when you are not at a hospital or clinic. Record the average of the two measurements. To check your blood pressure when you are not at a hospital or clinic, you can use: ? An automated blood pressure machine at a pharmacy. ? A home blood pressure monitor.  If you are between 25 years and 42 years old, ask your health care provider if you should take aspirin to prevent strokes.  Have regular diabetes screenings. This involves taking a blood sample to  check your fasting blood sugar level. ? If you are at a normal weight and have a low risk for diabetes, have this test once every three years after 68 years of age. ? If you are overweight and have a high risk for diabetes, consider being tested at a younger age or more often. Preventing infection Hepatitis B  If you have a higher risk for hepatitis B, you should be screened for this virus. You are considered at high risk for hepatitis B if: ? You were born in a country where hepatitis B is common. Ask your health care provider which countries are considered high risk. ? Your parents were born in a high-risk country, and you have not been immunized against hepatitis B (hepatitis B vaccine). ? You have HIV or AIDS. ? You use needles to inject street drugs. ? You live with someone who has hepatitis B. ? You have had sex with someone who has hepatitis B. ? You get hemodialysis treatment. ? You take certain medicines for conditions, including cancer, organ transplantation, and autoimmune conditions.  Hepatitis C  Blood testing is recommended for: ? Everyone born from [birth year] through 1966. ? Anyone with known risk factors for hepatitis C.  Sexually transmitted infections (STIs)  You should be screened for sexually transmitted infections (STIs) including gonorrhea and chlamydia if: ? You are sexually active and are younger than 68 years of age. ? You are older than 68 years of age and your health care provider tells you that you are at risk for this type of infection. ? Your sexual activity has changed since you were last screened and you are at an increased risk for chlamydia or gonorrhea. Ask your health care provider if you are at risk.  If you do not have HIV, but are at risk, it may be recommended that you take a prescription medicine daily to prevent HIV infection. This is called pre-exposure prophylaxis (PrEP). You are considered at risk if: ? You are sexually active and do not regularly  use condoms or know the HIV status of your partner(s). ? You take drugs by injection. ? You are sexually active with a partner who has HIV.  Talk with your health care provider about whether you are at high risk of being infected with HIV. If you choose to begin PrEP, you should first be tested for HIV. You should then be tested every 3 months for as long as you are taking  PrEP. Pregnancy  If you are premenopausal and you may become pregnant, ask your health care provider about preconception counseling.  If you may become pregnant, take 400 to 800 micrograms (mcg) of folic acid every day.  If you want to prevent pregnancy, talk to your health care provider about birth control (contraception). Osteoporosis and menopause  Osteoporosis is a disease in which the bones lose minerals and strength with aging. This can result in serious bone fractures. Your risk for osteoporosis can be identified using a bone density scan.  If you are 24 years of age or older, or if you are at risk for osteoporosis and fractures, ask your health care provider if you should be screened.  Ask your health care provider whether you should take a calcium or vitamin D supplement to lower your risk for osteoporosis.  Menopause may have certain physical symptoms and risks.  Hormone replacement therapy may reduce some of these symptoms and risks. Talk to your health care provider about whether hormone replacement therapy is right for you. Follow these instructions at home:  Schedule regular health, dental, and eye exams.  Stay current with your immunizations.  Do not use any tobacco products including cigarettes, chewing tobacco, or electronic cigarettes.  If you are pregnant, do not drink alcohol.  If you are breastfeeding, limit how much and how often you drink alcohol.  Limit alcohol intake to no more than 1 drink per day for nonpregnant women. One drink equals 12 ounces of beer, 5 ounces of wine, or 1 ounces  of hard liquor.  Do not use street drugs.  Do not share needles.  Ask your health care provider for help if you need support or information about quitting drugs.  Tell your health care provider if you often feel depressed.  Tell your health care provider if you have ever been abused or do not feel safe at home. This information is not intended to replace advice given to you by your health care provider. Make sure you discuss any questions you have with your health care provider. Document Released: 10/06/2010 Document Revised: 08/29/2015 Document Reviewed: 12/25/2014 Elsevier Interactive Patient Education  [employer].

## 2018-01-06 NOTE — Assessment & Plan Note (Signed)
Cholesterol at goal on simvastatin 40 mg daily. Recent lipid panel reviewed with patient.

## 2018-01-06 NOTE — Assessment & Plan Note (Signed)
Flu shot given. Pneumonia declines. Shingrix declines. Tetanus up to date. Colonoscopy declines. Mammogram ordered, pap smear aged out and dexa declines. Counseled about sun safety and mole surveillance. Counseled about the dangers of distracted driving. Given 10 year screening recommendations.

## 2018-01-06 NOTE — Assessment & Plan Note (Signed)
Refill tizanidine today, uses rarely.

## 2018-01-31 DIAGNOSIS — L400 Psoriasis vulgaris: Secondary | ICD-10-CM | POA: Diagnosis not present

## 2018-01-31 DIAGNOSIS — L821 Other seborrheic keratosis: Secondary | ICD-10-CM | POA: Diagnosis not present

## 2018-01-31 DIAGNOSIS — Z79899 Other long term (current) drug therapy: Secondary | ICD-10-CM | POA: Diagnosis not present

## 2018-02-15 ENCOUNTER — Ambulatory Visit: Payer: Medicare Other | Admitting: Podiatry

## 2018-02-16 ENCOUNTER — Other Ambulatory Visit: Payer: Self-pay | Admitting: Internal Medicine

## 2018-02-17 ENCOUNTER — Encounter

## 2018-02-17 ENCOUNTER — Encounter: Payer: Self-pay | Admitting: Podiatry

## 2018-02-17 ENCOUNTER — Ambulatory Visit: Payer: Medicare Other | Admitting: Podiatry

## 2018-02-17 DIAGNOSIS — M79676 Pain in unspecified toe(s): Secondary | ICD-10-CM | POA: Diagnosis not present

## 2018-02-17 DIAGNOSIS — B351 Tinea unguium: Secondary | ICD-10-CM | POA: Diagnosis not present

## 2018-02-20 NOTE — Progress Notes (Signed)
She presents today after having not seen her for quite some time with a chief complaint of painful hallux nails bilaterally.  Objective: Vital signs are stable she is alert and oriented x3.  Hallux nails are thick yellow dystrophic.  Assessment painful dystrophic nails hallux bilateral.  Plan: Discussed the need for removal of the toenails today which she does not want to do at this time.  I debrided these nails thoroughly today and will follow-up with her as needed.

## 2018-09-07 DIAGNOSIS — Z79899 Other long term (current) drug therapy: Secondary | ICD-10-CM | POA: Diagnosis not present

## 2018-09-07 DIAGNOSIS — L400 Psoriasis vulgaris: Secondary | ICD-10-CM | POA: Diagnosis not present

## 2018-09-07 DIAGNOSIS — L821 Other seborrheic keratosis: Secondary | ICD-10-CM | POA: Diagnosis not present

## 2018-09-07 DIAGNOSIS — L409 Psoriasis, unspecified: Secondary | ICD-10-CM | POA: Diagnosis not present

## 2018-09-24 ENCOUNTER — Other Ambulatory Visit: Payer: Self-pay | Admitting: Internal Medicine

## 2018-10-15 ENCOUNTER — Other Ambulatory Visit: Payer: Self-pay | Admitting: Internal Medicine

## 2018-11-21 ENCOUNTER — Other Ambulatory Visit: Payer: Self-pay | Admitting: Internal Medicine

## 2018-12-06 ENCOUNTER — Other Ambulatory Visit (INDEPENDENT_AMBULATORY_CARE_PROVIDER_SITE_OTHER): Payer: Medicare Other

## 2018-12-06 ENCOUNTER — Ambulatory Visit (INDEPENDENT_AMBULATORY_CARE_PROVIDER_SITE_OTHER): Payer: Medicare Other | Admitting: Internal Medicine

## 2018-12-06 ENCOUNTER — Ambulatory Visit: Payer: Self-pay | Admitting: *Deleted

## 2018-12-06 ENCOUNTER — Other Ambulatory Visit: Payer: Self-pay

## 2018-12-06 ENCOUNTER — Encounter: Payer: Self-pay | Admitting: Internal Medicine

## 2018-12-06 VITALS — BP 126/82 | HR 78 | Temp 98.1°F | Ht 66.0 in | Wt 193.0 lb

## 2018-12-06 DIAGNOSIS — G35 Multiple sclerosis: Secondary | ICD-10-CM

## 2018-12-06 DIAGNOSIS — G35D Multiple sclerosis, unspecified: Secondary | ICD-10-CM

## 2018-12-06 DIAGNOSIS — Z23 Encounter for immunization: Secondary | ICD-10-CM

## 2018-12-06 DIAGNOSIS — R350 Frequency of micturition: Secondary | ICD-10-CM

## 2018-12-06 DIAGNOSIS — L409 Psoriasis, unspecified: Secondary | ICD-10-CM

## 2018-12-06 DIAGNOSIS — M7989 Other specified soft tissue disorders: Secondary | ICD-10-CM

## 2018-12-06 LAB — URINALYSIS, ROUTINE W REFLEX MICROSCOPIC
Bilirubin Urine: NEGATIVE
Ketones, ur: NEGATIVE
Leukocytes,Ua: NEGATIVE
Nitrite: NEGATIVE
RBC / HPF: NONE SEEN (ref 0–?)
Specific Gravity, Urine: 1.01 (ref 1.000–1.030)
Total Protein, Urine: NEGATIVE
Urine Glucose: NEGATIVE
Urobilinogen, UA: 0.2 (ref 0.0–1.0)
WBC, UA: NONE SEEN (ref 0–?)
pH: 7 (ref 5.0–8.0)

## 2018-12-06 LAB — CBC
HCT: 38.4 % (ref 36.0–46.0)
Hemoglobin: 12.7 g/dL (ref 12.0–15.0)
MCHC: 33.1 g/dL (ref 30.0–36.0)
MCV: 91.7 fl (ref 78.0–100.0)
Platelets: 223 10*3/uL (ref 150.0–400.0)
RBC: 4.18 Mil/uL (ref 3.87–5.11)
RDW: 14.4 % (ref 11.5–15.5)
WBC: 7 10*3/uL (ref 4.0–10.5)

## 2018-12-06 LAB — COMPREHENSIVE METABOLIC PANEL
ALT: 14 U/L (ref 0–35)
AST: 12 U/L (ref 0–37)
Albumin: 3.6 g/dL (ref 3.5–5.2)
Alkaline Phosphatase: 56 U/L (ref 39–117)
BUN: 20 mg/dL (ref 6–23)
CO2: 30 mEq/L (ref 19–32)
Calcium: 8.9 mg/dL (ref 8.4–10.5)
Chloride: 104 mEq/L (ref 96–112)
Creatinine, Ser: 0.66 mg/dL (ref 0.40–1.20)
GFR: 88.63 mL/min (ref >60.00)
Glucose, Bld: 88 mg/dL (ref 70–99)
Potassium: 4.4 mEq/L (ref 3.5–5.1)
Sodium: 140 mEq/L (ref 135–145)
Total Bilirubin: 0.4 mg/dL (ref 0.2–1.2)
Total Protein: 6.4 g/dL (ref 6.0–8.3)

## 2018-12-06 LAB — BRAIN NATRIURETIC PEPTIDE: Pro B Natriuretic peptide (BNP): 38 pg/mL (ref 0.0–100.0)

## 2018-12-06 NOTE — Progress Notes (Signed)
   Subjective:   Patient ID: Alison Dominguez, female    DOB: Sep 27, 1950, 68 y.o.   MRN: [id number]  HPI The patient is a 68 YO female coming in for concerns about leg swelling. Started in June with off and on swelling. Usually in the ankles. Since the last 2 weeks she is having some pain 2/10 with redness and the swelling. She has been eating some more salty foods and drinking some less water recently. She denies SOB with lying flat or walking. Denies change in urination or pain with urination. Feels like her weight is increasing gradually despite her efforts to lose weight.  She also has concerns about frequency of urination. Going on for a couple of days. Denies pain with urination. Denies fevers or chills. Denies abdominal pain. Denies new back pain. Denies blood in urine. Has not tried anything. Overall worsening.  She was also concerned given her past MS (diagnosed in 1999) never had treatment for it. Last MRI was >10 years ago and was unable to compare to prior to evaluate progression versus stability. She denies new weakness or vision changes. Does not feel she has had any MS flare. Was not sure if this was related or not.   Review of Systems  Constitutional: Negative.   HENT: Negative.   Eyes: Negative.   Respiratory: Negative for cough, chest tightness and shortness of breath.   Cardiovascular: Positive for leg swelling. Negative for chest pain and palpitations.  Gastrointestinal: Negative for abdominal distention, abdominal pain, constipation, diarrhea, nausea and vomiting.  Musculoskeletal: Negative.   Skin: Negative.   Neurological: Negative.   Psychiatric/Behavioral: Negative.     Objective:  Physical Exam Constitutional:      Appearance: She is well-developed.  HENT:     Head: Normocephalic and atraumatic.  Neck:     Musculoskeletal: Normal range of motion.  Cardiovascular:     Rate and Rhythm: Normal rate and regular rhythm.     Comments: No jvd Pulmonary:     Effort:  Pulmonary effort is normal. No respiratory distress.     Breath sounds: Normal breath sounds. No wheezing or rales.  Abdominal:     General: Bowel sounds are normal. There is no distension.     Palpations: Abdomen is soft.     Tenderness: There is no abdominal tenderness. There is no rebound.  Musculoskeletal:     Right lower leg: Edema present.     Left lower leg: Edema present.     Comments: 1+ edema to knees bilaterally  Skin:    General: Skin is warm and dry.  Neurological:     Mental Status: She is alert and oriented to person, place, and time.     Coordination: Coordination normal.     Vitals:   12/06/18 1536  BP: 126/82  Pulse: 78  Temp: 98.1 F (36.7 C)  TempSrc: Oral  SpO2: 99%  Weight: 193 lb (87.5 kg)  Height: 5\' 6"  (1.676 m)    Assessment & Plan:  Flu shot given at visit

## 2018-12-06 NOTE — Patient Instructions (Addendum)
We will check some blood work today to try to find the cause of the swelling.    Chronic Venous Insufficiency Chronic venous insufficiency is a condition where the leg veins cannot effectively pump blood from the legs to the heart. This happens when the vein walls are either stretched, weakened, or damaged, or when the valves inside the vein are damaged. With the right treatment, you should be able to continue with an active life. This condition is also called venous stasis. What are the causes? Common causes of this condition include:  High blood pressure inside the veins (venous hypertension).  Sitting or standing too long, causing increased blood pressure in the leg veins.  A blood clot that blocks blood flow in a vein (deep vein thrombosis, DVT).  Inflammation of a vein (phlebitis) that causes a blood clot to form.  Tumors in the pelvis that cause blood to back up. What increases the risk? The following factors may make you more likely to develop this condition:  Having a family history of this condition.  Obesity.  Pregnancy.  Living without enough regular physical activity or exercise (sedentary lifestyle).  Smoking.  Having a job that requires long periods of standing or sitting in one place.  Being a certain age. Women in their 14s and 63s and men in their 32s are more likely to develop this condition. What are the signs or symptoms? Symptoms of this condition include:  Veins that are enlarged, bulging, or twisted (varicose veins).  Skin breakdown or ulcers.  Reddened skin or dark discoloration of skin on the leg between the knee and ankle.  Brown, smooth, tight, and painful skin just above the ankle, usually on the inside of the leg (lipodermatosclerosis).  Swelling of the legs. How is this diagnosed? This condition may be diagnosed based on:  Your medical history.  A physical exam.  Tests, such as: ? A procedure that creates an image of a blood vessel  and nearby organs and provides information about blood flow through the blood vessel (duplex ultrasound). ? A procedure that tests blood flow (plethysmography). ? A procedure that looks at the veins using X-ray and dye (venogram). How is this treated? The goals of treatment are to help you return to an active life and to minimize pain or disability. Treatment depends on the severity of your condition, and it may include:  Wearing compression stockings. These can help relieve symptoms and help prevent your condition from getting worse. However, they do not cure the condition.  Sclerotherapy. This procedure involves an injection of a solution that shrinks damaged veins.  Surgery. This may involve: ? Removing a diseased vein (vein stripping). ? Cutting off blood flow through the vein (laser ablation surgery). ? Repairing or reconstructing a valve within the affected vein. Follow these instructions at home:      Wear compression stockings as told by your health care provider. These stockings help to prevent blood clots and reduce swelling in your legs.  Take over-the-counter and prescription medicines only as told by your health care provider.  Stay active by exercising, walking, or doing different activities. Ask your health care provider what activities are safe for you and how much exercise you need.  Drink enough fluid to keep your urine pale yellow.  Do not use any products that contain nicotine or tobacco, such as cigarettes, e-cigarettes, and chewing tobacco. If you need help quitting, ask your health care provider.  Keep all follow-up visits as told by your health  care provider. This is important. Contact a health care provider if you:  Have redness, swelling, or more pain in the affected area.  See a red streak or line that goes up or down from the affected area.  Have skin breakdown or skin loss in the affected area, even if the breakdown is small.  Get an injury in the  affected area. Get help right away if:  You get an injury and an open wound in the affected area.  You have: ? Severe pain that does not get better with medicine. ? Sudden numbness or weakness in the foot or ankle below the affected area. ? Trouble moving your foot or ankle. ? A fever. ? Worse or persistent symptoms. ? Chest pain. ? Shortness of breath. Summary  Chronic venous insufficiency is a condition where the leg veins cannot effectively pump blood from the legs to the heart.  Chronic venous insufficiency occurs when the vein walls become stretched, weakened, or damaged, or when valves within the vein are damaged.  Treatment depends on how severe your condition is. It often involves wearing compression stockings and may involve having a procedure.  Make sure you stay active by exercising, walking, or doing different activities. Ask your health care provider what activities are safe for you and how much exercise you need. This information is not intended to replace advice given to you by your health care provider. Make sure you discuss any questions you have with your health care provider. Document Released: 07/27/2006 Document Revised: 12/14/2017 Document Reviewed: 12/14/2017 Elsevier Patient Education  2020 [employer].

## 2018-12-06 NOTE — Telephone Encounter (Signed)
Noted  

## 2018-12-06 NOTE — Telephone Encounter (Signed)
   Reason for Disposition . [1] MODERATE leg swelling (e.g., swelling extends up to knees) AND [2] new onset or worsening  Answer Assessment - Initial Assessment Questions 1. ONSET: "When did the swelling start?" (e.g., minutes, hours, days)     Since June has had swelling 2. LOCATION: "What part of the leg is swollen?"  "Are both legs swollen or just one leg?"     Swelling right below knee down to foot and ankles bilaterally, right slightly worse than left  3. SEVERITY: "How bad is the swelling?" (e.g., localized; mild, moderate, severe)  - Localized - small area of swelling localized to one leg  - MILD pedal edema - swelling limited to foot and ankle, pitting edema < 1/4 inch (6 mm) deep, rest and elevation eliminate most or all swelling  - MODERATE edema - swelling of lower leg to knee, pitting edema > 1/4 inch (6 mm) deep, rest and elevation only partially reduce swelling  - SEVERE edema - swelling extends above knee, facial or hand swelling present      Moderate swelling  4. REDNESS: "Does the swelling look red or infected?"     Has redness around both ankles, no broken skin or weeping 5. PAIN: "Is the swelling painful to touch?" If so, ask: "How painful is it?"   (Scale 1-10; mild, moderate or severe)    2/10  6. FEVER: "Do you have a fever?" If so, ask: "What is it, how was it measured, and when did it start?"      No fever  7. CAUSE: "What do you think is causing the leg swelling?"     Possibly arthritis 8. MEDICAL HISTORY: "Do you have a history of heart failure, kidney disease, liver failure, or cancer?"     Denies history of the above  9. RECURRENT SYMPTOM: "Have you had leg swelling before?" If so, ask: "When was the last time?" "What happened that time?"     No previous issues with this type of swelling before 09/2018 10. OTHER SYMPTOMS: "Do you have any other symptoms?" (e.g., chest pain, difficulty breathing)       No chest pain, or difficulty breathing 11. PREGNANCY: "Is  there any chance you are pregnant?" "When was your last menstrual period?"       N/A  Protocols used: LEG SWELLING AND EDEMA-A-AH  Patient states she has had bilateral lower leg swelling that extends down to her ankles and toes since June.  She states her dermatologist told her it could be arthritis.  She denies significant pain, fever, shortness of breath, or chest pain.  States there is some redness around both ankles and they feel warm.  She states her skin feels very tight around her calves and ankles like it might burst per patient.  The swelling is not improved by elevating her legs.  Advised patient she should see her PCP within the next 24 hours.  Transferred her call to Dr. Nathanial Millman office, and she is scheduled for today at 3:40pm.  Advised patient that if she experiences any chest pain or shortness of breath before her appointment today that she should proceed to the ER.  Patient expressed understanding and is agreeable.

## 2018-12-07 DIAGNOSIS — L409 Psoriasis, unspecified: Secondary | ICD-10-CM | POA: Insufficient documentation

## 2018-12-07 DIAGNOSIS — R350 Frequency of micturition: Secondary | ICD-10-CM | POA: Insufficient documentation

## 2018-12-07 DIAGNOSIS — M7989 Other specified soft tissue disorders: Secondary | ICD-10-CM | POA: Insufficient documentation

## 2018-12-07 NOTE — Assessment & Plan Note (Signed)
Ruling out CHF, kidney or liver insufficiency. Advised her to decrease salt and increase water to help. Elevate legs. Talked to her about compression stockings. Checking CBC and CMP to make sure no drug reaction as we do not have any monitoring labs from her dermatologist.

## 2018-12-07 NOTE — Assessment & Plan Note (Signed)
Checking U/A and culture.  

## 2018-12-07 NOTE — Assessment & Plan Note (Signed)
Taking methotrexate per dermatology for this and she is not sure if this is related. We did talk about how you can get psoriatic arthritis associated but she does not have any swollen joints so this is not likely.

## 2018-12-07 NOTE — Assessment & Plan Note (Signed)
Reassurance given that this leg swelling does not sound like MS flare. MRI reviewed and she does have typical MS lesions on last MRI. She has not had symptoms other than some balance problems per her reports and no follow up of this in a long time.

## 2018-12-08 ENCOUNTER — Other Ambulatory Visit: Payer: Self-pay

## 2018-12-08 LAB — URINE CULTURE
MICRO NUMBER:: 834573
SPECIMEN QUALITY:: ADEQUATE

## 2018-12-08 LAB — ANA, IFA COMPREHENSIVE PANEL
Anti Nuclear Antibody (ANA): NEGATIVE
ENA SM Ab Ser-aCnc: <1 AI
SM/RNP: <1 AI
SSA (Ro) (ENA) Antibody, IgG: <1 AI
SSB (La) (ENA) Antibody, IgG: <1 AI
Scleroderma (Scl-70) (ENA) Antibody, IgG: <1 AI
ds DNA Ab: <1 IU/mL

## 2018-12-08 LAB — EXTRA LAV TOP TUBE

## 2018-12-08 MED ORDER — SIMVASTATIN 40 MG PO TABS: 40.0000 mg | ORAL_TABLET | Freq: Every day | ORAL | 1 refills | Status: DC

## 2018-12-08 MED ORDER — TIZANIDINE HCL 4 MG PO TABS: 4.0000 mg | ORAL_TABLET | Freq: Every day | ORAL | 2 refills | Status: DC | PRN

## 2019-01-10 ENCOUNTER — Ambulatory Visit: Payer: Medicare Other | Admitting: Internal Medicine

## 2019-01-30 ENCOUNTER — Telehealth: Payer: Self-pay | Admitting: *Deleted

## 2019-01-30 ENCOUNTER — Ambulatory Visit: Payer: Medicare Other

## 2019-01-30 NOTE — Progress Notes (Unsigned)
Subjective:   Alison Dominguez is a 68 y.o. female who presents for Medicare Annual (Subsequent) preventive examination. I connected with patient by a telephone and verified that I am speaking with the correct person using two identifiers. Patient stated full name and DOB. Patient gave permission to continue with telephonic visit. Patient's location was at home and Nurse's location was at [place] office. Participants during this visit included patient and nurse.  Review of Systems:     Sleep patterns: {SX; SLEEP PATTERNS:18802::"feels rested on waking","does not get up to void","gets up *** times nightly to void","sleeps *** hours nightly"}.    Home Safety/Smoke Alarms: Feels safe in home. Smoke alarms in place.  Living environment; residence and Firearm Safety: {Rehab home environment / accessibility:30080::"no firearms","firearms stored safely"}. Seat Belt Safety/Bike Helmet: Wears seat belt.      Objective:     Vitals: There were no vitals taken for this visit.  There is no height or weight on file to calculate BMI.  Advanced Directives 02/03/2016 05/30/2014  Does Patient Have a Medical Advance Directive? No No  Would patient like information on creating a medical advance directive? No - patient declined information No - patient declined information    Tobacco Social History   Tobacco Use  Smoking Status Former Smoker  Smokeless Tobacco Never Used  Tobacco Comment   Single, no children. 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 ANXIETY   . CAROTID ARTERY STENOSIS, LEFT   . DYSLIPIDEMIA   . Multiple sclerosis ([place])   . PVD   . TOBACCO ABUSE    Past Surgical History:  Procedure Laterality Date  . Left foot surgery    . TUBAL LIGATION  1992   Family History  Problem Relation Age of Onset  . Diabetes Brother   . Diabetes Other        Family hx   . Hypertension Sister    Social History   Socioeconomic History  . Marital status: Single    Spouse name: Not on file  . Number of children: Not on file  . Years of education: Not on file  . Highest education level: Not on file  Occupational History  . Not on file  Social Needs  . Financial resource strain: Not on file  . Food insecurity    Worry: Not on file    Inability: Not on file  . Transportation needs    Medical: Not on file    Non-medical: Not on file  Tobacco Use  . Smoking status: Former Research scientist (life sciences)  . Smokeless tobacco: Never Used  . Tobacco comment: Single, no children. 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 Alcohol use: No  . Drug use: No  . Sexual activity: Not Currently  Lifestyle  . Physical activity    Days per week: Not on file    Minutes per session: Not on file  . Stress: Not on file  Relationships  . Social Herbalist on phone: Not on file    Gets together: Not on file    Attends religious service: Not on file    Active member of club or organization: Not on file    Attends meetings of clubs or organizations: Not on file    Relationship status: Not on file  Other Topics Concern  . Not on  file  Social History Narrative  . Not on file    Outpatient Encounter Medications as of 01/30/2019  Medication Sig  . aspirin 81 MG tablet Take 81 mg by mouth daily.    Marland Kitchen augmented betamethasone dipropionate (DIPROLENE-AF) 0.05 % ointment APPLY TO AFFECTED AREA ON FEET AT BEDTIME  . clotrimazole-betamethasone (LOTRISONE) cream   . folic acid (FOLVITE) 1 MG tablet Take 1 mg by mouth daily.  Marland Kitchen ibuprofen (ADVIL,MOTRIN) 200 MG tablet Take 200 mg by mouth every 6 (six) hours as needed.    . methotrexate (RHEUMATREX) 2.5 MG tablet 2 TABLETS BY MOUTH AS DIRECTED TAKE 2 TABS ON TUES AM,1 AT NIGHT, THEN 2 TABS WEDNESDAY AM  . methotrexate (RHEUMATREX) 2.5 MG tablet   . pantoprazole (PROTONIX) 40 MG tablet Take 1 tablet (40 mg total) by  mouth daily.  . simvastatin (ZOCOR) 40 MG tablet Take 1 tablet (40 mg total) by mouth at bedtime.  Marland Kitchen tiZANidine (ZANAFLEX) 4 MG tablet Take 1 tablet (4 mg total) by mouth daily as needed.   No facility-administered encounter medications on file as of 01/30/2019.     Activities of Daily Living No flowsheet data found.  Patient Care Team: Myrlene Broker, MD as PCP - General (Internal Medicine) Pricilla Riffle, MD (Cardiology) York Spaniel, MD (Neurology) Nada Libman, MD (Vascular Surgery) Teodora Medici, MD (Obstetrics and Gynecology)    Assessment:   This is a routine wellness examination for Alison Dominguez. Physical assessment deferred to PCP.   Exercise Activities and Dietary recommendations   Diet (meal preparation, eat out, water intake, caffeinated beverages, dairy products, fruits and vegetables): {Desc; diets:16563}   Goals   None     Fall Risk Fall Risk  12/06/2018 03/28/2012  Falls in the past year? 0 No   Is the patient's home free of loose throw rugs in walkways, pet beds, electrical cords, etc?   {Blank single:19197::"yes","no"}      Grab bars in the bathroom? {Blank single:19197::"yes","no"}      Handrails on the stairs?   {Blank single:19197::"yes","no"}      Adequate lighting?   {Blank single:19197::"yes","no"}  Depression Screen PHQ 2/9 Scores 12/06/2018 03/28/2012  PHQ - 2 Score 0 1     Cognitive Function        Immunization History  Administered Date(s) Administered  . Fluad Quad(high Dose 65+) 12/06/2018  . Influenza Split 01/05/2012, 02/22/2013  . Influenza, High Dose Seasonal PF 02/19/2015, 01/04/2018  . Pneumococcal Conjugate-13 07/24/2014  . Pneumococcal Polysaccharide-23 03/28/2012  . Tdap 07/28/2010  . Zoster 07/28/2010   Screening Tests Health Maintenance  Topic Date Due  . Hepatitis C Screening  1951/01/16  . COLONOSCOPY  04/14/1999  . MAMMOGRAM  03/28/2014  . PNA vac Low Risk Adult (2 of 2 - PPSV23) 03/28/2017  .  TETANUS/TDAP  07/27/2020  . INFLUENZA VACCINE  Completed  . DEXA SCAN  Completed      Plan:      I have personally reviewed and noted the following in the patient's chart:   . Medical and social history . Use of alcohol, tobacco or illicit drugs  . Current medications and supplements . Functional ability and status . Nutritional status . Physical activity . Advanced directives . List of other physicians . Screenings to include cognitive, depression, and falls . Referrals and appointments  In addition, I have reviewed and discussed with patient certain preventive protocols, quality metrics, and best practice recommendations. A written personalized care plan for preventive services as  well as general preventive health recommendations were provided to patient.     Wanda Plump, RN  01/30/2019

## 2019-01-30 NOTE — Telephone Encounter (Signed)
Nurse contacted patient to complete an AWV. Patient explained that the reason she agreed to do the AWV was to discuss that she continues to have swelling in her legs and feet and she wants to know what doctor she should see to take care of this issue. Nurse explained that the swelling in her legs could be a great many things and the PCP should see her to evaluate. Ms. Kedzierski stated that she saw PCP not long ago about the swelling and Dr. Sharlet Salina told her to decrease the amount of salt she was eating. The patient further explained that she does not use salt at all and that she feels that the swelling is coming from arthritis and again asked what type of doctor should she see to evaluate. Nurse discussed with patient that she would send a message to PCP to inform her about the swelling and that Dr. Sharlet Salina will determine how to treat the swelling in her legs and feet. The nurse did not complete the AWV.

## 2019-01-30 NOTE — Telephone Encounter (Signed)
Can mail her rx for compression stockings to try if she would like. This is likely due to leaky veins in the legs and she should increase water intake and decrease total sodium intake to 3000 mg daily.

## 2019-01-31 NOTE — Telephone Encounter (Signed)
Patient Informed of MD response. States "she will see an orthopedics doctor because we obviously have no idea what we are doing." told me that she does not wear compression stocking and told Dr. Sharlet Salina that at her last visit and stated "so you are telling me after all my complaining about my ankle hurting that you are not going to do an x-ray to see what is going on?" I stated that is not what I was saying, that I was only letting her know what Dr. Sharlet Salina had stated. Patient proceeded to hang up the phone.

## 2019-02-21 ENCOUNTER — Ambulatory Visit: Payer: Medicare Other | Admitting: Podiatry

## 2019-02-22 DIAGNOSIS — I872 Venous insufficiency (chronic) (peripheral): Secondary | ICD-10-CM | POA: Diagnosis not present

## 2019-02-22 DIAGNOSIS — I8311 Varicose veins of right lower extremity with inflammation: Secondary | ICD-10-CM | POA: Diagnosis not present

## 2019-02-22 DIAGNOSIS — L400 Psoriasis vulgaris: Secondary | ICD-10-CM | POA: Diagnosis not present

## 2019-02-22 DIAGNOSIS — Z79899 Other long term (current) drug therapy: Secondary | ICD-10-CM | POA: Diagnosis not present

## 2019-02-22 DIAGNOSIS — I8312 Varicose veins of left lower extremity with inflammation: Secondary | ICD-10-CM | POA: Diagnosis not present

## 2019-03-09 ENCOUNTER — Other Ambulatory Visit: Payer: Self-pay

## 2019-03-09 ENCOUNTER — Ambulatory Visit: Payer: Medicare Other | Admitting: Podiatry

## 2019-03-09 DIAGNOSIS — L03039 Cellulitis of unspecified toe: Secondary | ICD-10-CM

## 2019-03-09 DIAGNOSIS — L03032 Cellulitis of left toe: Secondary | ICD-10-CM

## 2019-03-09 DIAGNOSIS — L03031 Cellulitis of right toe: Secondary | ICD-10-CM | POA: Diagnosis not present

## 2019-03-09 MED ORDER — NEOMYCIN-POLYMYXIN-HC 3.5-10000-1 OT SOLN: OTIC | 0 refills | Status: DC

## 2019-03-09 NOTE — Patient Instructions (Signed)

## 2019-03-11 NOTE — Progress Notes (Signed)
She presents today chief complaint of bilateral hallux nail pain.  She would like to have them removed.  Objective: Vital signs are stable she is alert and oriented x3.  Pulses are palpable.  Toenails are thick yellow dystrophic-like mycotic they are painful on palpation they are very deformed with mild erythema around the borders.  Assessment: Painful hallux nails bilateral.  Plan: Thorough discussion today of permanent versus temporary nail avulsions.  She understands and is amenable to it she understands that with the nail dystrophy without matrixectomy that it will more than likely return and regrow in the same manner.  She understands this and is amenable to it.  She understands that it may not return to grow at all.  We went ahead and performed local anesthesia to both toes.  Remove note both nail plates and she was given both oral and written home-going structures for care and soaking of the toe as well as a prescription for Cortisporin Otic to be applied twice daily.  I will follow-up with her in a couple weeks to make sure she is healing well.

## 2019-03-23 ENCOUNTER — Encounter: Payer: Self-pay | Admitting: Podiatry

## 2019-03-23 ENCOUNTER — Ambulatory Visit: Payer: Medicare Other | Admitting: Podiatry

## 2019-03-23 ENCOUNTER — Other Ambulatory Visit: Payer: Self-pay

## 2019-03-23 DIAGNOSIS — L03039 Cellulitis of unspecified toe: Secondary | ICD-10-CM

## 2019-03-23 DIAGNOSIS — Z9889 Other specified postprocedural states: Secondary | ICD-10-CM

## 2019-03-23 NOTE — Patient Instructions (Signed)

## 2019-03-26 NOTE — Progress Notes (Signed)
She presents today for follow-up of her nail avulsion hallux bilaterally.  States that they do not hurt and I am still soaking and covering them.  Objective: Vital signs are stable she is alert and oriented x3 presents today for temporary nail avulsions of the hallux bilaterally.  She denies fever chills nausea vomiting muscle aches and pain states that they are doing okay.  There is no erythema edema cellulitis drainage or odor no purulence no malodor.  Assessment: Well-healing surgical toes.  Plan: Encouraged her to soak Epson salt and warm water every other day until there is no sign of any granulation tissue which we would explain thoroughly.  She understands this is amenable to it covered with a Band-Aid will follow up with her on an as-needed basis.

## 2019-05-26 ENCOUNTER — Ambulatory Visit: Payer: Medicare Other

## 2019-05-28 ENCOUNTER — Ambulatory Visit: Payer: Medicare Other | Attending: Internal Medicine

## 2019-05-28 DIAGNOSIS — Z23 Encounter for immunization: Secondary | ICD-10-CM | POA: Insufficient documentation

## 2019-05-28 NOTE — Progress Notes (Signed)
   Covid-19 Vaccination Clinic  Name:  [name]    MRN: [id number] DOB: October 08, 1950  05/28/2019  [name] was observed post Covid-19 immunization for 15 minutes without incidence. She was provided with Vaccine Information Sheet and instruction to access the V-Safe system.   [name] was instructed to call 911 with any severe reactions post vaccine: Marland Kitchen Difficulty breathing  . Swelling of your face and throat  . A fast heartbeat  . A bad rash all over your body  . Dizziness and weakness    Immunizations Administered    Name Date Dose VIS Date Route   Pfizer COVID-19 Vaccine 05/28/2019  9:53 AM 0.3 mL 03/17/2019 Intramuscular   Manufacturer: [employer], [employer]   Lot: [id number]   NDC: [address]-0

## 2019-06-21 ENCOUNTER — Ambulatory Visit: Payer: Medicare Other | Attending: Internal Medicine

## 2019-06-21 DIAGNOSIS — Z23 Encounter for immunization: Secondary | ICD-10-CM

## 2019-06-21 NOTE — Progress Notes (Signed)
.     Covid-19 Vaccination Clinic  Name:  Alison Dominguez    MRN: [id number] DOB: [birth year]  06/21/2019  Ms. Alison Dominguez was observed post Covid-19 immunization for 15 minutes without incident. She was provided with Vaccine Information Sheet and instruction to access the V-Safe system.   Ms. Alison Dominguez was instructed to call 911 with any severe reactions post vaccine: Marland Kitchen Difficulty breathing  . Swelling of face and throat  . A fast heartbeat  . A bad rash all over body  . Dizziness and weakness   Immunizations Administered    Name Date Dose VIS Date Route   Pfizer COVID-19 Vaccine 06/21/2019  9:50 AM 0.3 mL 03/17/2019 Intramuscular   Manufacturer: [employer], [employer]   Lot: [id number]   NDC: [id number]

## 2019-06-28 ENCOUNTER — Telehealth (HOSPITAL_COMMUNITY): Payer: Self-pay

## 2019-06-28 ENCOUNTER — Other Ambulatory Visit: Payer: Self-pay | Admitting: *Deleted

## 2019-06-28 DIAGNOSIS — I6523 Occlusion and stenosis of bilateral carotid arteries: Secondary | ICD-10-CM

## 2019-06-28 NOTE — Telephone Encounter (Signed)
The above patient or their representative was contacted and gave the following answers to these questions:         Do you have any of the following symptoms?    NO  Fever                    Cough                   Shortness of breath  Do  you have any of the following other symptoms?    muscle pain         vomiting,        diarrhea        rash         weakness        red eye        abdominal pain         bruising          bruising or bleeding              joint pain           severe headache    Have you been in contact with someone who was or has been sick in the past 2 weeks?  NO  Yes                 Unsure                         Unable to assess   Does the person that you were in contact with have any of the following symptoms?   Cough         shortness of breath           muscle pain         vomiting,            diarrhea            rash            weakness           fever            red eye           abdominal pain           bruising  or  bleeding                joint pain                severe headache                 COMMENTS OR ACTION PLAN FOR THIS PATIENT:        ALL QUESTIONS WERE ANSWERED*[facility] PT HAS BEEN FULLY VACCINATED/[facility]

## 2019-06-29 ENCOUNTER — Ambulatory Visit (HOSPITAL_COMMUNITY)
Admission: RE | Admit: 2019-06-29 | Discharge: 2019-06-29 | Disposition: A | Discharge status: Home / Self Care | Payer: Medicare Other | Source: Ambulatory Visit | Attending: Surgery | Admitting: Surgery

## 2019-06-29 ENCOUNTER — Ambulatory Visit: Payer: Medicare Other | Admitting: Physician Assistant

## 2019-06-29 ENCOUNTER — Other Ambulatory Visit: Payer: Self-pay

## 2019-06-29 VITALS — BP 140/86 | HR 91 | Temp 97.9°F | Resp 16 | Ht 66.0 in | Wt 191.8 lb

## 2019-06-29 DIAGNOSIS — I872 Venous insufficiency (chronic) (peripheral): Secondary | ICD-10-CM

## 2019-06-29 DIAGNOSIS — I6523 Occlusion and stenosis of bilateral carotid arteries: Secondary | ICD-10-CM | POA: Insufficient documentation

## 2019-06-29 DIAGNOSIS — R600 Localized edema: Secondary | ICD-10-CM

## 2019-06-29 NOTE — Progress Notes (Signed)
Office Note     CC:  follow up Requesting Provider:  Myrlene Broker, *   HPI: Alison Dominguez is a 69 y.o. (1951-01-29) female who Dr. Myra Gianotti as been monitoring with known history of carotid artery stenosis, no history of carotidarteryintervention. She returns today for follow up.  She denies symptoms of intermittent claudication or rest pain.  She denies monocular blindness, facial drooping, slurred speech, extremity weakness. Her main complaint today is bilateral lower extremity edema.  She states this is accompanied by occasional skin weeping.  She said she saw her primary care physician who recommended reduced salt diet and compression stockings.  She says she has been wearing these and they have helped somewhat.  She complains of mild degree of aching lower extremities and itching in the lower legs.  She has gained approximately 50 pounds since retirement.  She denies history of DVT, lower extremity ulcers, anticoagulant medication.  The pt is on a statin for cholesterol management.  The pt is not a daily aspirin.   Other AC:  none The pt is not on  for hypertension.   The pt is not diabetic.   Tobacco hx:  Former, quit 2017  Past Medical History:  Diagnosis Date  . ANXIETY   . CAROTID ARTERY STENOSIS, LEFT   . DYSLIPIDEMIA   . Multiple sclerosis (HCC)   . PVD   . TOBACCO ABUSE     Past Surgical History:  Procedure Laterality Date  . Left foot surgery    . TUBAL LIGATION  1992    Social History   Socioeconomic History  . Marital status: Single    Spouse name: Not on file  . Number of children: Not on file  . Years of education: Not on file  . Highest education level: Not on file  Occupational History  . Not on file  Tobacco Use  . Smoking status: Former Games developer  . Smokeless tobacco: Never Used  . Tobacco comment: Single, no children. working Community education officer in [employer]   Substance and Sexual Activity  . Alcohol use: No  . Drug use: No  . Sexual activity: Not  Currently  Other Topics Concern  . Not on file  Social History Narrative  . Not on file   Social Determinants of Health   Financial Resource Strain:   . Difficulty of Paying Living Expenses:   Food Insecurity:   . Worried About Programme researcher, broadcasting/film/video in the Last Year:   . Barista in the Last Year:   Transportation Needs:   . Freight forwarder (Medical):   Marland Kitchen Lack of Transportation (Non-Medical):   Physical Activity:   . Days of Exercise per Week:   . Minutes of Exercise per Session:   Stress:   . Feeling of Stress :   Social Connections:   . Frequency of Communication with Friends and Family:   . Frequency of Social Gatherings with Friends and Family:   . Attends Religious Services:   . Active Member of Clubs or Organizations:   . Attends Banker Meetings:   Marland Kitchen Marital Status:   Intimate Partner Violence:   . Fear of Current or Ex-Partner:   . Emotionally Abused:   Marland Kitchen Physically Abused:   . Sexually Abused:     Family History  Problem Relation Age of Onset  . Diabetes Brother   . Diabetes Other        Family hx  . Hypertension Sister  Current Outpatient Medications  Medication Sig Dispense Refill  . aspirin 81 MG tablet Take 81 mg by mouth daily.      . folic acid (FOLVITE) 1 MG tablet Take 1 mg by mouth daily.  0  . ibuprofen (ADVIL,MOTRIN) 200 MG tablet Take 200 mg by mouth every 6 (six) hours as needed.      . methotrexate (RHEUMATREX) 2.5 MG tablet 2 TABLETS BY MOUTH AS DIRECTED TAKE 2 TABS ON TUES AM,1 AT NIGHT, THEN 2 TABS WEDNESDAY AM  1  . methotrexate (RHEUMATREX) 2.5 MG tablet     . pantoprazole (PROTONIX) 40 MG tablet Take 1 tablet (40 mg total) by mouth daily. 30 tablet 1  . simvastatin (ZOCOR) 40 MG tablet Take 1 tablet (40 mg total) by mouth at bedtime. 90 tablet 1  . tiZANidine (ZANAFLEX) 4 MG tablet Take 1 tablet (4 mg total) by mouth daily as needed. 30 tablet 2   No current facility-administered medications for this visit.     No Known Allergies   REVIEW OF SYSTEMS:   [X]  denotes positive finding, [ ]  denotes negative finding Cardiac  Comments:  Chest pain or chest pressure:    Shortness of breath upon exertion:    Short of breath when lying flat:    Irregular heart rhythm:        Vascular    Pain in calf, thigh, or hip brought on by ambulation:    Pain in feet at night that wakes you up from your sleep:     Blood clot in your veins:    Leg swelling:  x       Pulmonary    Oxygen at home:    Productive cough:     Wheezing:         Neurologic    Sudden weakness in arms or legs:     Sudden numbness in arms or legs:     Sudden onset of difficulty speaking or slurred speech:    Temporary loss of vision in one eye:     Problems with dizziness:         Gastrointestinal    Blood in stool:     Vomited blood:         Genitourinary    Burning when urinating:     Blood in urine:        Psychiatric    Major depression:         Hematologic    Bleeding problems:    Problems with blood clotting too easily:        Skin    Rashes or ulcers:        Constitutional    Fever or chills:      PHYSICAL EXAMINATION:  Vitals:   06/29/19 1021  Weight: 191 lb 12.8 oz (87 kg)  Height: 5\' 6"  (1.676 m)    General:  WDWN in NAD; vital signs documented above Gait: Not observed HENT: WNL, normocephalic Pulmonary: normal non-labored breathing , without Rales, rhonchi,  wheezing Cardiac: regular HR, without  Murmurs without carotid bruits Abdomen: soft, NT, no masses Skin: with mild erythema of the gaiter regions of both lower legs Vascular Exam/Pulses: Extremities: without ischemic changes, without Gangrene , without cellulitis; without open wounds;  Musculoskeletal: no muscle wasting or atrophy.  Trace pitting edema both lower extremities below the knees.  Neurologic: A&O X 3;  No focal weakness or paresthesias are detected Psychiatric:  The pt has Normal affect.  Pulse exam: 2+ radial  and  dorsalis pedis pulses.  Non-Invasive Vascular Imaging:    Carotid Duplex (02/08/17): <40% right internal carotid artery stenosis. Left internal carotid arterywith40-59% stenosis. Bilateral vertebral arteries are antegrade. Bilateral subclavian arteries are triphasic.  No significant change compared to the exams on 05/30/14 and 02/03/16.   TODAY (06/29/2019) Right Carotid: Velocities in the right ICA are consistent with a 1-39%  stenosis.  Left Carotid: Velocities in the left ICA are consistent with a 1-39%  stenosis.  Vertebrals: Bilateral vertebral arteries demonstrate antegrade flow.  Subclavians: Normal flow hemodynamics were seen in bilateral subclavian  arteries.    ASSESSMENT/PLAN:: 69 y.o. female here for follow up for carotid artery stenosis.  The patient's duplex studies are stable and she is asymptomatic.  Follow-up in 1 year  Venous insufficiency: We discussed in depth the etiology and management.  Encouraged compression stockings, weight loss, elevation, attention to lower extremity skin health.  We discussed venous duplex study to rule out reflux but she declines at this time.   Barbie Banner, PA-C [facility] [phone]  Clinic MD:  Oneida Alar

## 2019-07-03 ENCOUNTER — Other Ambulatory Visit: Payer: Self-pay | Admitting: *Deleted

## 2019-07-03 DIAGNOSIS — I6523 Occlusion and stenosis of bilateral carotid arteries: Secondary | ICD-10-CM

## 2019-07-08 ENCOUNTER — Other Ambulatory Visit: Payer: Self-pay | Admitting: Internal Medicine

## 2019-08-01 ENCOUNTER — Other Ambulatory Visit: Payer: Self-pay | Admitting: Internal Medicine

## 2019-08-04 ENCOUNTER — Other Ambulatory Visit: Payer: Self-pay | Admitting: Internal Medicine

## 2019-09-04 ENCOUNTER — Other Ambulatory Visit: Payer: Self-pay | Admitting: Internal Medicine

## 2019-10-02 ENCOUNTER — Telehealth: Payer: Self-pay | Admitting: Internal Medicine

## 2019-10-02 NOTE — Telephone Encounter (Signed)
appt made for 6.30.21 @ 9:40am Gave patient new address and told about mask wearing still needed

## 2019-10-02 NOTE — Telephone Encounter (Signed)
Spoke with Huston Foley at [employer]. Mrs. Northway called them regarding her refill and was extremely unprofessional because her refill was denied at our office.  Karin Golden [facility] [address], [place] - [address] [place] Phone:  [phone]  Fax:  [phone]     Please let me know if I need to call the patient to try to get her an appointment with you.

## 2019-10-02 NOTE — Telephone Encounter (Signed)
Yes please schedule pt an OV with [place]. She is ovedue for an annual.

## 2019-10-03 DIAGNOSIS — L0889 Other specified local infections of the skin and subcutaneous tissue: Secondary | ICD-10-CM | POA: Diagnosis not present

## 2019-10-03 DIAGNOSIS — Z79899 Other long term (current) drug therapy: Secondary | ICD-10-CM | POA: Diagnosis not present

## 2019-10-03 DIAGNOSIS — L400 Psoriasis vulgaris: Secondary | ICD-10-CM | POA: Diagnosis not present

## 2019-10-04 ENCOUNTER — Ambulatory Visit (INDEPENDENT_AMBULATORY_CARE_PROVIDER_SITE_OTHER): Payer: Medicare Other | Admitting: Internal Medicine

## 2019-10-04 ENCOUNTER — Encounter: Payer: Self-pay | Admitting: Internal Medicine

## 2019-10-04 ENCOUNTER — Other Ambulatory Visit: Payer: Self-pay

## 2019-10-04 VITALS — BP 126/84 | HR 87 | Temp 98.3°F | Ht 66.0 in | Wt 192.0 lb

## 2019-10-04 DIAGNOSIS — L409 Psoriasis, unspecified: Secondary | ICD-10-CM

## 2019-10-04 DIAGNOSIS — E782 Mixed hyperlipidemia: Secondary | ICD-10-CM | POA: Diagnosis not present

## 2019-10-04 DIAGNOSIS — Z0000 Encounter for general adult medical examination without abnormal findings: Secondary | ICD-10-CM

## 2019-10-04 DIAGNOSIS — I6523 Occlusion and stenosis of bilateral carotid arteries: Secondary | ICD-10-CM

## 2019-10-04 LAB — COMPREHENSIVE METABOLIC PANEL
ALT: 17 U/L (ref 0–35)
AST: 14 U/L (ref 0–37)
Albumin: 3.7 g/dL (ref 3.5–5.2)
Alkaline Phosphatase: 56 U/L (ref 39–117)
BUN: 14 mg/dL (ref 6–23)
CO2: 30 mEq/L (ref 19–32)
Calcium: 8.9 mg/dL (ref 8.4–10.5)
Chloride: 104 mEq/L (ref 96–112)
Creatinine, Ser: 0.65 mg/dL (ref 0.40–1.20)
GFR: 89.99 mL/min (ref >60.00)
Glucose, Bld: 99 mg/dL (ref 70–99)
Potassium: 4.1 mEq/L (ref 3.5–5.1)
Sodium: 140 mEq/L (ref 135–145)
Total Bilirubin: 0.6 mg/dL (ref 0.2–1.2)
Total Protein: 6.3 g/dL (ref 6.0–8.3)

## 2019-10-04 LAB — CBC
HCT: 40 % (ref 36.0–46.0)
Hemoglobin: 13.3 g/dL (ref 12.0–15.0)
MCHC: 33.3 g/dL (ref 30.0–36.0)
MCV: 90.4 fl (ref 78.0–100.0)
Platelets: 190 10*3/uL (ref 150.0–400.0)
RBC: 4.42 Mil/uL (ref 3.87–5.11)
RDW: 14.5 % (ref 11.5–15.5)
WBC: 5.2 10*3/uL (ref 4.0–10.5)

## 2019-10-04 LAB — LIPID PANEL
Cholesterol: 141 mg/dL (ref 0–200)
HDL: 55.9 mg/dL (ref >39.00)
LDL Cholesterol: 70 mg/dL (ref 0–99)
NonHDL: 84.78
Total CHOL/HDL Ratio: 3
Triglycerides: 73 mg/dL (ref 0.0–149.0)
VLDL: 14.6 mg/dL (ref 0.0–40.0)

## 2019-10-04 MED ORDER — TIZANIDINE HCL 4 MG PO TABS: 4.0000 mg | ORAL_TABLET | Freq: Every day | ORAL | 3 refills | Status: DC | PRN

## 2019-10-04 MED ORDER — SIMVASTATIN 40 MG PO TABS: 40.0000 mg | ORAL_TABLET | Freq: Every day | ORAL | 3 refills | Status: DC

## 2019-10-04 MED ORDER — PANTOPRAZOLE SODIUM 40 MG PO TBEC: 40.0000 mg | DELAYED_RELEASE_TABLET | Freq: Every day | ORAL | 3 refills | Status: DC

## 2019-10-04 NOTE — Patient Instructions (Signed)
Health Maintenance, Female Adopting a healthy lifestyle and getting preventive care are important in promoting health and wellness. Ask your health care provider about:  The right schedule for you to have regular tests and exams.  Things you can do on your own to prevent diseases and keep yourself healthy. What should I know about diet, weight, and exercise? Eat a healthy diet   Eat a diet that includes plenty of vegetables, fruits, low-fat dairy products, and lean protein.  Do not eat a lot of foods that are high in solid fats, added sugars, or sodium. Maintain a healthy weight Body mass index (BMI) is used to identify weight problems. It estimates body fat based on height and weight. Your health care provider can help determine your BMI and help you achieve or maintain a healthy weight. Get regular exercise Get regular exercise. This is one of the most important things you can do for your health. Most adults should:  Exercise for at least 150 minutes each week. The exercise should increase your heart rate and make you sweat (moderate-intensity exercise).  Do strengthening exercises at least twice a week. This is in addition to the moderate-intensity exercise.  Spend less time sitting. Even light physical activity can be beneficial. Watch cholesterol and blood lipids Have your blood tested for lipids and cholesterol at 70 years of age, then have this test every 5 years. Have your cholesterol levels checked more often if:  Your lipid or cholesterol levels are high.  You are older than 70 years of age.  You are at high risk for heart disease. What should I know about cancer screening? Depending on your health history and family history, you may need to have cancer screening at various ages. This may include screening for:  Breast cancer.  Cervical cancer.  Colorectal cancer.  Skin cancer.  Lung cancer. What should I know about heart disease, diabetes, and high blood  pressure? Blood pressure and heart disease  High blood pressure causes heart disease and increases the risk of stroke. This is more likely to develop in people who have high blood pressure readings, are of African descent, or are overweight.  Have your blood pressure checked: ? Every 3-5 years if you are 18-39 years of age. ? Every year if you are 40 years old or older. Diabetes Have regular diabetes screenings. This checks your fasting blood sugar level. Have the screening done:  Once every three years after age 40 if you are at a normal weight and have a low risk for diabetes.  More often and at a younger age if you are overweight or have a high risk for diabetes. What should I know about preventing infection? Hepatitis B If you have a higher risk for hepatitis B, you should be screened for this virus. Talk with your health care provider to find out if you are at risk for hepatitis B infection. Hepatitis C Testing is recommended for:  Everyone born from 1945 through 1965.  Anyone with known risk factors for hepatitis C. Sexually transmitted infections (STIs)  Get screened for STIs, including gonorrhea and chlamydia, if: ? You are sexually active and are younger than 70 years of age. ? You are older than 70 years of age and your health care provider tells you that you are at risk for this type of infection. ? Your sexual activity has changed since you were last screened, and you are at increased risk for chlamydia or gonorrhea. Ask your health care provider if   you are at risk.  Ask your health care provider about whether you are at high risk for HIV. Your health care provider may recommend a prescription medicine to help prevent HIV infection. If you choose to take medicine to prevent HIV, you should first get tested for HIV. You should then be tested every 3 months for as long as you are taking the medicine. Pregnancy  If you are about to stop having your period (premenopausal) and  you may become pregnant, seek counseling before you get pregnant.  Take 400 to 800 micrograms (mcg) of folic acid every day if you become pregnant.  Ask for birth control (contraception) if you want to prevent pregnancy. Osteoporosis and menopause Osteoporosis is a disease in which the bones lose minerals and strength with aging. This can result in bone fractures. If you are 65 years old or older, or if you are at risk for osteoporosis and fractures, ask your health care provider if you should:  Be screened for bone loss.  Take a calcium or vitamin D supplement to lower your risk of fractures.  Be given hormone replacement therapy (HRT) to treat symptoms of menopause. Follow these instructions at home: Lifestyle  Do not use any products that contain nicotine or tobacco, such as cigarettes, e-cigarettes, and chewing tobacco. If you need help quitting, ask your health care provider.  Do not use street drugs.  Do not share needles.  Ask your health care provider for help if you need support or information about quitting drugs. Alcohol use  Do not drink alcohol if: ? Your health care provider tells you not to drink. ? You are pregnant, may be pregnant, or are planning to become pregnant.  If you drink alcohol: ? Limit how much you use to 0-1 drink a day. ? Limit intake if you are breastfeeding.  Be aware of how much alcohol is in your drink. In the [place] one drink equals one 12 oz bottle of beer (355 mL), one 5 oz glass of wine (148 mL), or one 1 oz glass of hard liquor (44 mL). General instructions  Schedule regular health, dental, and eye exams.  Stay current with your vaccines.  Tell your health care provider if: ? You often feel depressed. ? You have ever been abused or do not feel safe at home. Summary  Adopting a healthy lifestyle and getting preventive care are important in promoting health and wellness.  Follow your health care provider's instructions about healthy  diet, exercising, and getting tested or screened for diseases.  Follow your health care provider's instructions on monitoring your cholesterol and blood pressure. This information is not intended to replace advice given to you by your health care provider. Make sure you discuss any questions you have with your health care provider. Document Revised: 03/16/2018 Document Reviewed: 03/16/2018 Elsevier Patient Education  2020 Elsevier Inc.  

## 2019-10-04 NOTE — Progress Notes (Signed)
Subjective:   Patient ID: Alison Dominguez, female    DOB: 11-23-1950, 69 y.o.   MRN: [id number]  HPI Here for medicare wellness and physical, no new complaints. Please see A/P for status and treatment of chronic medical problems.   Diet: heart healthy  Physical activity: sedentary Depression/mood screen: negative Hearing: intact to whispered voice Visual acuity: grossly normal, overdue annual eye exam  ADLs: capable Fall risk: none Home safety: good Cognitive evaluation: intact to orientation, naming, recall and repetition EOL planning: adv directives discussed    Office Visit from 12/06/2018 in [facility]  PHQ-2 Total Score 0      I have personally reviewed and have noted 1. The patient's medical and social history - reviewed today no changes 2. Their use of alcohol, tobacco or illicit drugs 3. Their current medications and supplements 4. The patient's functional ability including ADL's, fall risks, home safety risks and hearing or visual impairment. 5. Diet and physical activities 6. Evidence for depression or mood disorders 7. Care team reviewed and updated  Patient Care Team: Myrlene Broker, MD as PCP - General (Internal Medicine) Pricilla Riffle, MD (Cardiology) York Spaniel, MD (Neurology) Nada Libman, MD (Vascular Surgery) Teodora Medici, MD (Obstetrics and Gynecology) Past Medical History:  Diagnosis Date  . ANXIETY   . CAROTID ARTERY STENOSIS, LEFT   . DYSLIPIDEMIA   . Multiple sclerosis (HCC)   . PVD   . TOBACCO ABUSE    Past Surgical History:  Procedure Laterality Date  . Left foot surgery    . TUBAL LIGATION  1992   Family History  Problem Relation Age of Onset  . Diabetes Brother   . Diabetes Other        Family hx  . Hypertension Sister    Review of Systems  Constitutional: Negative.   HENT: Negative.   Eyes: Negative.   Respiratory: Negative for cough, chest tightness and shortness of breath.     Cardiovascular: Negative for chest pain, palpitations and leg swelling.  Gastrointestinal: Negative for abdominal distention, abdominal pain, constipation, diarrhea, nausea and vomiting.  Musculoskeletal: Negative.   Skin: Negative.   Neurological: Negative.   Psychiatric/Behavioral: Negative.     Objective:  Physical Exam Constitutional:      Appearance: She is well-developed.  HENT:     Head: Normocephalic and atraumatic.  Cardiovascular:     Rate and Rhythm: Normal rate and regular rhythm.  Pulmonary:     Effort: Pulmonary effort is normal. No respiratory distress.     Breath sounds: Normal breath sounds. No wheezing or rales.  Abdominal:     General: Bowel sounds are normal. There is no distension.     Palpations: Abdomen is soft.     Tenderness: There is no abdominal tenderness. There is no rebound.  Musculoskeletal:     Cervical back: Normal range of motion.  Skin:    General: Skin is warm and dry.  Neurological:     Mental Status: She is alert and oriented to person, place, and time.     Coordination: Coordination normal.     Vitals:   10/04/19 0931  BP: 126/84  Pulse: 87  Temp: 98.3 F (36.8 C)  TempSrc: Oral  SpO2: 99%  Weight: 192 lb (87.1 kg)  Height: 5\' 6"  (1.676 m)    This visit occurred during the SARS-CoV-2 public health emergency.  Safety protocols were in place, including screening questions prior to the visit, additional usage of  staff PPE, and extensive cleaning of exam room while observing appropriate contact time as indicated for disinfecting solutions.   Assessment & Plan:

## 2019-10-05 ENCOUNTER — Encounter: Payer: Self-pay | Admitting: Internal Medicine

## 2019-10-05 NOTE — Assessment & Plan Note (Signed)
Recent imaging stable and can follow every several years.

## 2019-10-05 NOTE — Assessment & Plan Note (Signed)
Checking lipid panel and adjust simvastatin 40 mg daily as needed.  

## 2019-10-05 NOTE — Assessment & Plan Note (Signed)
Flu shot yearly. Covid-19 up to date. Pneumonia complete. Shingrix counseled. Tetanus due 2022. Colonoscopy due declines. Mammogram due declines, pap smear aged out and dexa declines further has had previously. Counseled about sun safety and mole surveillance. Counseled about the dangers of distracted driving. Given 10 year screening recommendations.

## 2019-10-05 NOTE — Assessment & Plan Note (Signed)
Taking methotrexate and folic acid per dermatology. Checking CBC and CMP.

## 2020-10-26 ENCOUNTER — Other Ambulatory Visit: Payer: Self-pay | Admitting: Internal Medicine

## 2020-10-31 ENCOUNTER — Other Ambulatory Visit: Payer: Self-pay | Admitting: Internal Medicine

## 2020-11-24 ENCOUNTER — Other Ambulatory Visit: Payer: Self-pay | Admitting: Internal Medicine

## 2020-11-28 ENCOUNTER — Other Ambulatory Visit: Payer: Self-pay | Admitting: Internal Medicine

## 2021-06-11 DIAGNOSIS — L400 Psoriasis vulgaris: Secondary | ICD-10-CM | POA: Diagnosis not present

## 2021-07-22 ENCOUNTER — Encounter: Payer: Self-pay | Admitting: Podiatry

## 2021-07-22 ENCOUNTER — Ambulatory Visit: Payer: Medicare Other | Admitting: Podiatry

## 2021-07-22 DIAGNOSIS — L600 Ingrowing nail: Secondary | ICD-10-CM | POA: Diagnosis not present

## 2021-07-22 DIAGNOSIS — L603 Nail dystrophy: Secondary | ICD-10-CM | POA: Diagnosis not present

## 2021-07-22 MED ORDER — NEOMYCIN-POLYMYXIN-HC 1 % OT SOLN: OTIC | 1 refills | Status: DC

## 2021-07-22 NOTE — Patient Instructions (Signed)

## 2021-07-22 NOTE — Progress Notes (Signed)
She presents today chief complaint of painful hallux nails bilaterally.  She states that I like to have them removed permanently this time since we only did it temporarily last time.  She states that they grew back and looked good for just a few days and then started to get thick and ugly again.  She states that they are painful that bothersome with shoe gear and she is ready to be done with them. ? ?Objective: Vital signs are stable alert and oriented x3 pulses are palpable.  No erythema edema cellulitis drainage or odor toenails are thick yellow dystrophic clinical mycotic painful on palpation.  Distal onycholysis. ? ?Assessment: Painful nail dystrophy painful ingrown nails. ? ?Plan: Chemical matricectomy was performed today after local anesthetic was administered.  Patient tolerated procedure well.  She was given both oral and written home-going instruction for the care and soaking of the toes as well as a prescription of Cortisporin Otic to be applied twice daily after soaking.  I will follow-up with her in 2 to 3 weeks to make sure she is healing well should she have questions or concerns she will notify us immediately. ?

## 2021-08-14 ENCOUNTER — Ambulatory Visit: Payer: Medicare Other | Admitting: Podiatry

## 2021-08-14 ENCOUNTER — Encounter: Payer: Self-pay | Admitting: Podiatry

## 2021-08-14 DIAGNOSIS — Z9889 Other specified postprocedural states: Secondary | ICD-10-CM

## 2021-08-14 DIAGNOSIS — L600 Ingrowing nail: Secondary | ICD-10-CM

## 2021-08-17 NOTE — Progress Notes (Signed)
We discussed etiology pathology conservative versus surgical therapies.  She states that she continues to soak Betadine and warm water or Epsom salts and warm water. ? ?Objective: Vital signs are stable she is alert and oriented x3.  Pulses are palpable.  Toes with the permanent nail avulsions were performed hallux bilateral demonstrate epithelial budding with epithelization.  There is no longer any eschar or scab present.  It appears to be healing very nicely. ? ?Assessment: Well-healing surgical toes hallux bilateral. ? ?Plan: Encouraged her to continue to set and I like to follow-up with her in another 3 to 4 weeks to make sure she is doing well with this. ?

## 2021-09-11 ENCOUNTER — Ambulatory Visit: Payer: Medicare Other | Admitting: Podiatry

## 2021-09-11 ENCOUNTER — Encounter: Payer: Self-pay | Admitting: Podiatry

## 2021-09-11 DIAGNOSIS — L600 Ingrowing nail: Secondary | ICD-10-CM

## 2021-09-11 DIAGNOSIS — Z9889 Other specified postprocedural states: Secondary | ICD-10-CM

## 2021-09-11 NOTE — Progress Notes (Signed)
Alison Dominguez presents today for follow-up of her matrixectomy's hallux bilateral.  She is concerned because there is still draining with her bandages.  She states that she has not been soaking lately and she is out of the Betadine.  Objective: Vital signs are stable alert and oriented x3 there is no erythema cellulitis drainage odor she does currently have some granulation tissue with epithelialization and it is less than 25% of the total nail bed coverage.  So I think she is doing very well at this point I see no signs of infection.  Assessment: Slowly healing matricectomy's hallux bilateral.  Plan: Continue to soak every other day Epsom salts and warm water small amount of Neosporin if necessary covered today leave open at bedtime follow-up with me in 1 month.

## 2021-10-21 ENCOUNTER — Ambulatory Visit: Payer: Medicare Other | Admitting: Podiatry

## 2021-12-16 ENCOUNTER — Ambulatory Visit: Payer: Medicare Other | Admitting: Podiatry

## 2021-12-23 ENCOUNTER — Encounter: Payer: Self-pay | Admitting: Internal Medicine

## 2021-12-23 ENCOUNTER — Ambulatory Visit (INDEPENDENT_AMBULATORY_CARE_PROVIDER_SITE_OTHER): Payer: Medicare Other | Admitting: Internal Medicine

## 2021-12-23 VITALS — BP 136/82 | HR 88 | Ht 65.5 in | Wt 184.0 lb

## 2021-12-23 DIAGNOSIS — Z1231 Encounter for screening mammogram for malignant neoplasm of breast: Secondary | ICD-10-CM

## 2021-12-23 DIAGNOSIS — Z23 Encounter for immunization: Secondary | ICD-10-CM | POA: Diagnosis not present

## 2021-12-23 DIAGNOSIS — G35 Multiple sclerosis: Secondary | ICD-10-CM | POA: Diagnosis not present

## 2021-12-23 DIAGNOSIS — Z0000 Encounter for general adult medical examination without abnormal findings: Secondary | ICD-10-CM

## 2021-12-23 DIAGNOSIS — E785 Hyperlipidemia, unspecified: Secondary | ICD-10-CM

## 2021-12-23 DIAGNOSIS — I6523 Occlusion and stenosis of bilateral carotid arteries: Secondary | ICD-10-CM | POA: Diagnosis not present

## 2021-12-23 DIAGNOSIS — K219 Gastro-esophageal reflux disease without esophagitis: Secondary | ICD-10-CM | POA: Diagnosis not present

## 2021-12-23 LAB — COMPREHENSIVE METABOLIC PANEL
ALT: 12 U/L (ref 0–35)
AST: 11 U/L (ref 0–37)
Albumin: 3.6 g/dL (ref 3.5–5.2)
Alkaline Phosphatase: 62 U/L (ref 39–117)
BUN: 15 mg/dL (ref 6–23)
CO2: 32 mEq/L (ref 19–32)
Calcium: 9 mg/dL (ref 8.4–10.5)
Chloride: 103 mEq/L (ref 96–112)
Creatinine, Ser: 0.66 mg/dL (ref 0.40–1.20)
GFR: 87.47 mL/min (ref >60.00)
Glucose, Bld: 92 mg/dL (ref 70–99)
Potassium: 4.1 mEq/L (ref 3.5–5.1)
Sodium: 140 mEq/L (ref 135–145)
Total Bilirubin: 0.5 mg/dL (ref 0.2–1.2)
Total Protein: 6.7 g/dL (ref 6.0–8.3)

## 2021-12-23 LAB — LIPID PANEL
Cholesterol: 132 mg/dL (ref 0–200)
HDL: 55.9 mg/dL (ref >39.00)
LDL Cholesterol: 61 mg/dL (ref 0–99)
NonHDL: 75.79
Total CHOL/HDL Ratio: 2
Triglycerides: 76 mg/dL (ref 0.0–149.0)
VLDL: 15.2 mg/dL (ref 0.0–40.0)

## 2021-12-23 LAB — CBC
HCT: 42.3 % (ref 36.0–46.0)
Hemoglobin: 14 g/dL (ref 12.0–15.0)
MCHC: 33.1 g/dL (ref 30.0–36.0)
MCV: 89.3 fl (ref 78.0–100.0)
Platelets: 224 10*3/uL (ref 150.0–400.0)
RBC: 4.74 Mil/uL (ref 3.87–5.11)
RDW: 13.7 % (ref 11.5–15.5)
WBC: 5.7 10*3/uL (ref 4.0–10.5)

## 2021-12-23 MED ORDER — TIZANIDINE HCL 4 MG PO TABS: 4.0000 mg | ORAL_TABLET | Freq: Every day | ORAL | 3 refills | Status: DC | PRN

## 2021-12-23 MED ORDER — PANTOPRAZOLE SODIUM 40 MG PO TBEC: 40.0000 mg | DELAYED_RELEASE_TABLET | Freq: Every day | ORAL | 3 refills | Status: DC

## 2021-12-23 MED ORDER — SIMVASTATIN 40 MG PO TABS: 40.0000 mg | ORAL_TABLET | Freq: Every day | ORAL | 3 refills | Status: DC

## 2021-12-23 NOTE — Progress Notes (Signed)
Subjective:   Patient ID: Alison Dominguez, female    DOB: 06/17/1950, 71 y.o.   MRN: [id number]  Medication Refill Associated symptoms include arthralgias and myalgias. Pertinent negatives include no abdominal pain, chest pain, coughing, nausea or vomiting.  Hand Injury  Pertinent negatives include no chest pain.   Here for medicare wellness and physical, no new complaints. Please see A/P for status and treatment of chronic medical problems.   Diet: heart healthy Physical activity: sedentary Depression/mood screen: negative Hearing: intact to whispered voice Visual acuity: grossly normal, performs annual eye exam  ADLs: capable Fall risk: none Home safety: good Cognitive evaluation: intact to orientation, naming, recall and repetition EOL planning: adv directives discussed, not in place  [employer] Visit from 12/23/2021 in [place] at [facility] Total Score 0           03/28/2012    3:47 PM 12/06/2018    3:44 PM 12/23/2021   11:20 AM  [place] in the past year? No 0 1  Was there an injury with Fall?   1  Fall Risk Category Calculator   2  Fall Risk Category   Moderate  Patient Fall Risk Level  Low fall risk Moderate fall risk  Patient at Risk for Falls Due to   Impaired balance/gait  Fall risk Follow up   Education provided;Falls prevention discussed    I have personally reviewed and have noted 1. The patient's medical and social history - reviewed today no changes 2. Their use of alcohol, tobacco or illicit drugs 3. Their current medications and supplements 4. The patient's functional ability including ADL's, fall risks, home safety risks and hearing or visual impairment. 5. Diet and physical activities 6. Evidence for depression or mood disorders 7. Care team reviewed and updated 8.  The patient is not on an opioid pain medication.  Patient Care Team: Hoyt Koch, MD as PCP - General (Internal Medicine) Fay Records,  MD (Cardiology) Kathrynn Ducking, MD (Inactive) (Neurology) Serafina Mitchell, MD (Vascular Surgery) Mezer, Nadara Mustard, MD (Obstetrics and Gynecology) Past Medical History:  Diagnosis Date   ANXIETY    CAROTID ARTERY STENOSIS, LEFT    DYSLIPIDEMIA    Multiple sclerosis ([place])    PVD    TOBACCO ABUSE    Past Surgical History:  Procedure Laterality Date   Left foot surgery     TUBAL LIGATION  1992   Family History  Problem Relation Age of Onset   Diabetes Brother    Diabetes Other        Family hx   Hypertension Sister    Review of Systems  Constitutional: Negative.   HENT: Negative.    Eyes: Negative.   Respiratory:  Negative for cough, chest tightness and shortness of breath.   Cardiovascular:  Negative for chest pain, palpitations and leg swelling.  Gastrointestinal:  Negative for abdominal distention, abdominal pain, constipation, diarrhea, nausea and vomiting.  Musculoskeletal:  Positive for arthralgias and myalgias.  Skin: Negative.   Neurological: Negative.   Psychiatric/Behavioral: Negative.      Objective:  Physical Exam Constitutional:      Appearance: She is well-developed.  HENT:     Head: Normocephalic and atraumatic.  Cardiovascular:     Rate and Rhythm: Normal rate and regular rhythm.  Pulmonary:     Effort: Pulmonary effort is normal. No respiratory distress.     Breath sounds: Normal breath sounds. No wheezing or rales.  Abdominal:  General: Bowel sounds are normal. There is no distension.     Palpations: Abdomen is soft.     Tenderness: There is no abdominal tenderness. There is no rebound.  Musculoskeletal:        General: Tenderness present.     Cervical back: Normal range of motion.  Skin:    General: Skin is warm and dry.  Neurological:     Mental Status: She is alert and oriented to person, place, and time.     Coordination: Coordination normal.     Vitals:   12/23/21 1056  BP: 136/82  Pulse: 88  SpO2: 96%  Weight: 184 lb (83.5  kg)  Height: 5' 5.5" (1.664 m)    Assessment & Plan:  Flu shot given at visit

## 2021-12-23 NOTE — Assessment & Plan Note (Signed)
Discussed 1-39% stenosis and recommendation to keep her on simvastatin 40 mg daily. She states taking medicine however last refill 2021. Refilled today for 1 year and plan for follow up US 2024 carotids.

## 2021-12-23 NOTE — Assessment & Plan Note (Addendum)
Gets muscle cramps sometimes. Refill tizanidine 4 mg daily prn. Taking methotrexate and folic acid. Checking CBC and CMP.

## 2021-12-23 NOTE — Assessment & Plan Note (Signed)
Flu shot given. Covid-19 counseled. Pneumonia gets at pharmacy. Shingrix she states completed. Tetanus due at pharmacy. Colonoscopy declines and declines cologuard. Mammogram ordered, pap smear aged out and dexa declines further. Counseled about sun safety and mole surveillance. Counseled about the dangers of distracted driving. Given 10 year screening recommendations.

## 2021-12-23 NOTE — Assessment & Plan Note (Signed)
Checking lipid panel. Taking simvastatin 40 mg daily (last refilled 2021 but she states taking regularly). Adjust as needed for LDL<100 goal (<70 ideal).

## 2021-12-23 NOTE — Assessment & Plan Note (Signed)
Takes protonix 40 mg daily prn. Uses rarely. Refilled today.

## 2022-01-22 ENCOUNTER — Telehealth: Payer: Self-pay | Admitting: Internal Medicine

## 2022-01-22 NOTE — Telephone Encounter (Signed)
[facility] called stating they were trying to get in contact with Alison Dominguez but was not successful and will not continue to call her to schedule her mammogram. I also tried to call the patient and was unsuccessful in reaching her so a vm was left.

## 2022-12-24 ENCOUNTER — Other Ambulatory Visit: Payer: Self-pay | Admitting: Internal Medicine

## 2023-02-18 ENCOUNTER — Other Ambulatory Visit: Payer: Self-pay | Admitting: Internal Medicine

## 2023-06-22 DIAGNOSIS — H26493 Other secondary cataract, bilateral: Secondary | ICD-10-CM | POA: Diagnosis not present

## 2023-06-22 DIAGNOSIS — H04123 Dry eye syndrome of bilateral lacrimal glands: Secondary | ICD-10-CM | POA: Diagnosis not present

## 2023-06-22 DIAGNOSIS — H524 Presbyopia: Secondary | ICD-10-CM | POA: Diagnosis not present

## 2023-06-22 DIAGNOSIS — H4720 Unspecified optic atrophy: Secondary | ICD-10-CM | POA: Diagnosis not present

## 2023-06-22 DIAGNOSIS — H52203 Unspecified astigmatism, bilateral: Secondary | ICD-10-CM | POA: Diagnosis not present

## 2023-06-22 DIAGNOSIS — H43813 Vitreous degeneration, bilateral: Secondary | ICD-10-CM | POA: Diagnosis not present

## 2023-06-29 DIAGNOSIS — H26492 Other secondary cataract, left eye: Secondary | ICD-10-CM | POA: Diagnosis not present

## 2023-10-25 ENCOUNTER — Encounter: Payer: Self-pay | Admitting: Family Medicine

## 2023-10-25 ENCOUNTER — Ambulatory Visit (INDEPENDENT_AMBULATORY_CARE_PROVIDER_SITE_OTHER): Admitting: Family Medicine

## 2023-10-25 ENCOUNTER — Other Ambulatory Visit: Payer: Self-pay | Admitting: Internal Medicine

## 2023-10-25 VITALS — BP 134/80 | HR 98 | Temp 98.0°F | Resp 18 | Ht 65.5 in | Wt 168.0 lb

## 2023-10-25 DIAGNOSIS — Z0184 Encounter for antibody response examination: Secondary | ICD-10-CM

## 2023-10-25 DIAGNOSIS — J0110 Acute frontal sinusitis, unspecified: Secondary | ICD-10-CM

## 2023-10-25 MED ORDER — TIZANIDINE HCL 4 MG PO TABS: 4.0000 mg | ORAL_TABLET | Freq: Every day | ORAL | 0 refills | Status: DC | PRN

## 2023-10-25 MED ORDER — AMOXICILLIN-POT CLAVULANATE 875-125 MG PO TABS: 1.0000 | ORAL_TABLET | Freq: Two times a day (BID) | ORAL | 0 refills | Status: AC

## 2023-10-25 MED ORDER — SIMVASTATIN 40 MG PO TABS: 40.0000 mg | ORAL_TABLET | Freq: Every day | ORAL | 0 refills | Status: DC

## 2023-10-25 NOTE — Telephone Encounter (Unsigned)
 Copied from CRM [id number]. Topic: Clinical - Medication Refill >> Oct 25, 2023 11:59 AM Burnard DEL wrote: Medication: simvastatin  (ZOCOR ) 40 MG tablet  Has the patient contacted their pharmacy? No (Agent: If no, request that the patient contact the pharmacy for the refill. If patient does not wish to contact the pharmacy document the reason why and proceed with request.) (Agent: If yes, when and what did the pharmacy advise?)  This is the patient's preferred pharmacy:    [facility] [id number] - [place], [place] - [address] [place] [address] Phone: [phone] Fax: [phone]  Is this the correct pharmacy for this prescription? Yes If no, delete pharmacy and type the correct one.   Has the prescription been filled recently? No  Is the patient out of the medication? Yes  Has the patient been seen for an appointment in the last year OR does the patient have an upcoming appointment? Yes  Can we respond through MyChart? Yes  Agent: Please be advised that Rx refills may take up to 3 business days. We ask that you follow-up with your pharmacy.

## 2023-10-25 NOTE — Progress Notes (Signed)
 Assessment & Plan:  1. Acute non-recurrent frontal sinusitis (Primary) Education provided on sinus infections. - amoxicillin -clavulanate (AUGMENTIN ) 875-125 MG tablet; Take 1 tablet by mouth 2 (two) times daily for 7 days.  Dispense: 14 tablet; Refill: 0  2. Immunity status testing - Measles/Mumps/Rubella Immunity; Future   Handicap placard provided.  Follow up plan: Return if symptoms worsen or fail to improve.  Niki Rung, MSN, APRN, FNP-C  Subjective:  HPI: Alison Dominguez is a 73 y.o. female presenting on 10/25/2023 for Sinusitis (HA, facial pain and pressure - L>R, started about 1 week, unknown fever, slight cough, green production, left ear pressure /Note - also having left dental pain.) and Form Completion (Handicap placard )  Patient complains of cough, headache, ear pain/pressure, and facial pain/pressure. She denies fever, shortness of breath, wheezing, and body aches. Onset of symptoms was 1 week ago, gradually worsening since that time. She is drinking plenty of fluids. Evaluation to date: none. Treatment to date: aspirin, Ibuprofen. She does not smoke.    ROS: Negative unless specifically indicated above in HPI.   Relevant past medical history reviewed and updated as indicated.   Allergies and medications reviewed and updated.   Current Outpatient Medications:    aspirin 81 MG tablet, Take 81 mg by mouth daily.  , Disp: , Rfl:    folic acid (FOLVITE) 1 MG tablet, Take 1 mg by mouth daily., Disp: , Rfl: 0   pantoprazole  (PROTONIX ) 40 MG tablet, Take 1 tablet (40 mg total) by mouth daily., Disp: 90 tablet, Rfl: 3   simvastatin  (ZOCOR ) 40 MG tablet, Take 1 tablet (40 mg total) by mouth at bedtime., Disp: 90 tablet, Rfl: 3   tiZANidine  (ZANAFLEX ) 4 MG tablet, Take 1 tablet (4 mg total) by mouth daily as needed., Disp: 90 tablet, Rfl: 3   ibuprofen (ADVIL,MOTRIN) 200 MG tablet, Take 200 mg by mouth every 6 (six) hours as needed.  , Disp: , Rfl:    methotrexate  (RHEUMATREX) 2.5 MG tablet, 2 TABLETS BY MOUTH AS DIRECTED TAKE 2 TABS ON TUES AM,1 AT NIGHT, THEN 2 TABS WEDNESDAY AM (Patient not taking: Reported on 10/25/2023), Disp: , Rfl: 1  No Known Allergies  Objective:   BP 134/80   Pulse 98   Temp 98 F (36.7 C)   Resp 18   Ht 5' 5.5 (1.664 m)   Wt 168 lb (76.2 kg)   SpO2 98%   BMI 27.53 kg/m    Physical Exam Vitals reviewed.  Constitutional:      General: She is not in acute distress.    Appearance: Normal appearance. She is not ill-appearing, toxic-appearing or diaphoretic.  HENT:     Head: Normocephalic and atraumatic.     Right Ear: Tympanic membrane, ear canal and external ear normal. There is no impacted cerumen.     Left Ear: Tympanic membrane, ear canal and external ear normal. There is no impacted cerumen.     Nose: No congestion or rhinorrhea.     Right Sinus: Frontal sinus tenderness present. No maxillary sinus tenderness.     Left Sinus: Frontal sinus tenderness present. No maxillary sinus tenderness.     Mouth/Throat:     Mouth: Mucous membranes are moist.     Pharynx: Oropharynx is clear. No oropharyngeal exudate or posterior oropharyngeal erythema.  Eyes:     General: No scleral icterus.       Right eye: No discharge.        Left eye: No discharge.  Conjunctiva/sclera: Conjunctivae normal.  Cardiovascular:     Rate and Rhythm: Normal rate and regular rhythm.     Heart sounds: Normal heart sounds. No murmur heard.    No friction rub. No gallop.  Pulmonary:     Effort: Pulmonary effort is normal. No respiratory distress.     Breath sounds: Normal breath sounds. No stridor. No wheezing, rhonchi or rales.  Musculoskeletal:        General: Normal range of motion.     Cervical back: Normal range of motion.  Lymphadenopathy:     Cervical: No cervical adenopathy.  Skin:    General: Skin is warm and dry.     Capillary Refill: Capillary refill takes less than 2 seconds.  Neurological:     General: No focal  deficit present.     Mental Status: She is alert and oriented to person, place, and time. Mental status is at baseline.  Psychiatric:        Mood and Affect: Mood normal.        Behavior: Behavior normal.        Thought Content: Thought content normal.        Judgment: Judgment normal.

## 2024-01-10 ENCOUNTER — Ambulatory Visit: Admitting: Internal Medicine

## 2024-01-12 ENCOUNTER — Ambulatory Visit: Admitting: Internal Medicine

## 2024-02-22 ENCOUNTER — Ambulatory Visit (INDEPENDENT_AMBULATORY_CARE_PROVIDER_SITE_OTHER): Admitting: Internal Medicine

## 2024-02-22 ENCOUNTER — Encounter: Payer: Self-pay | Admitting: Internal Medicine

## 2024-02-22 VITALS — BP 116/70 | HR 85 | Temp 97.5°F | Ht 65.5 in | Wt 163.8 lb

## 2024-02-22 DIAGNOSIS — G35D Multiple sclerosis, unspecified: Secondary | ICD-10-CM

## 2024-02-22 DIAGNOSIS — Z0000 Encounter for general adult medical examination without abnormal findings: Secondary | ICD-10-CM

## 2024-02-22 DIAGNOSIS — M25561 Pain in right knee: Secondary | ICD-10-CM | POA: Diagnosis not present

## 2024-02-22 DIAGNOSIS — E785 Hyperlipidemia, unspecified: Secondary | ICD-10-CM

## 2024-02-22 DIAGNOSIS — Z23 Encounter for immunization: Secondary | ICD-10-CM | POA: Diagnosis not present

## 2024-02-22 DIAGNOSIS — G8929 Other chronic pain: Secondary | ICD-10-CM

## 2024-02-22 DIAGNOSIS — K219 Gastro-esophageal reflux disease without esophagitis: Secondary | ICD-10-CM

## 2024-02-22 LAB — LIPID PANEL
Cholesterol: 147 mg/dL (ref 0–200)
HDL: 54.4 mg/dL (ref >39.00)
LDL Cholesterol: 75 mg/dL (ref 0–99)
NonHDL: 92.87
Total CHOL/HDL Ratio: 3
Triglycerides: 87 mg/dL (ref 0.0–149.0)
VLDL: 17.4 mg/dL (ref 0.0–40.0)

## 2024-02-22 LAB — COMPREHENSIVE METABOLIC PANEL WITH GFR
ALT: 16 U/L (ref 0–35)
AST: 16 U/L (ref 0–37)
Albumin: 3.8 g/dL (ref 3.5–5.2)
Alkaline Phosphatase: 48 U/L (ref 39–117)
BUN: 15 mg/dL (ref 6–23)
CO2: 31 meq/L (ref 19–32)
Calcium: 9 mg/dL (ref 8.4–10.5)
Chloride: 103 meq/L (ref 96–112)
Creatinine, Ser: 0.61 mg/dL (ref 0.40–1.20)
GFR: 87.8 mL/min (ref >60.00)
Glucose, Bld: 98 mg/dL (ref 70–99)
Potassium: 4.3 meq/L (ref 3.5–5.1)
Sodium: 140 meq/L (ref 135–145)
Total Bilirubin: 0.7 mg/dL (ref 0.2–1.2)
Total Protein: 6.4 g/dL (ref 6.0–8.3)

## 2024-02-22 LAB — CBC
HCT: 42.7 % (ref 36.0–46.0)
Hemoglobin: 14.1 g/dL (ref 12.0–15.0)
MCHC: 33 g/dL (ref 30.0–36.0)
MCV: 89.5 fl (ref 78.0–100.0)
Platelets: 216 K/uL (ref 150.0–400.0)
RBC: 4.77 Mil/uL (ref 3.87–5.11)
RDW: 13.7 % (ref 11.5–15.5)
WBC: 5.7 K/uL (ref 4.0–10.5)

## 2024-02-22 MED ORDER — PANTOPRAZOLE SODIUM 40 MG PO TBEC: 40.0000 mg | DELAYED_RELEASE_TABLET | Freq: Every day | ORAL | 3 refills | Status: AC

## 2024-02-22 MED ORDER — SIMVASTATIN 40 MG PO TABS: 40.0000 mg | ORAL_TABLET | Freq: Every day | ORAL | 3 refills | Status: AC

## 2024-02-22 MED ORDER — TIZANIDINE HCL 4 MG PO TABS: 4.0000 mg | ORAL_TABLET | Freq: Every day | ORAL | 3 refills | Status: AC | PRN

## 2024-02-22 NOTE — Progress Notes (Signed)
   Subjective:   Patient ID: Alison Dominguez, female    DOB: February 12, 1951, 73 y.o.   MRN: [id number]  The patient is here for physical. Pertinent topics discussed: Discussed the use of AI scribe software for clinical note transcription with the patient, who gave verbal consent to proceed.  History of Present Illness Alison Dominguez is a 73 year old female who presents with difficulty walking and inquiries about physical therapy.  She has been experiencing increasing difficulty with walking and is interested in exploring physical therapy options to improve her mobility. She has not previously undergone physical therapy and is unsure of the process.  Approximately five to six years ago, she experienced a fall at home that resulted in a significant knee injury. Since the fall, her knee has not fully recovered, leading to ongoing issues and worsening symptoms over time. She describes herself as inactive and notes that she has not engaged in regular exercise since retiring.  PMH, [facility], social history reviewed and updated  Review of Systems  Constitutional: Negative.   HENT: Negative.    Eyes: Negative.   Respiratory:  Negative for cough, chest tightness and shortness of breath.   Cardiovascular:  Negative for chest pain, palpitations and leg swelling.  Gastrointestinal:  Negative for abdominal distention, abdominal pain, constipation, diarrhea, nausea and vomiting.  Musculoskeletal:  Positive for arthralgias.  Skin: Negative.   Neurological: Negative.   Psychiatric/Behavioral: Negative.      Objective:  Physical Exam Constitutional:      Appearance: She is well-developed.  HENT:     Head: Normocephalic and atraumatic.  Cardiovascular:     Rate and Rhythm: Normal rate and regular rhythm.  Pulmonary:     Effort: Pulmonary effort is normal. No respiratory distress.     Breath sounds: Normal breath sounds. No wheezing or rales.  Abdominal:     General: Bowel sounds are normal. There is no  distension.     Palpations: Abdomen is soft.     Tenderness: There is no abdominal tenderness.  Musculoskeletal:        General: Tenderness present.     Cervical back: Normal range of motion.  Skin:    General: Skin is warm and dry.  Neurological:     Mental Status: She is alert and oriented to person, place, and time.     Coordination: Coordination abnormal.     Comments: cane     Vitals:   02/22/24 1013  BP: 116/70  Pulse: 85  Temp: (!) 97.5 F (36.4 C)  TempSrc: Oral  SpO2: 98%  Weight: 163 lb 12.8 oz (74.3 kg)  Height: 5' 5.5 (1.664 m)    Assessment & Plan:  Prevnar 20 given at visit

## 2024-02-22 NOTE — Patient Instructions (Signed)
 We will check the labs and have given you the prescriptions.  We will do physical therapy.

## 2024-02-23 ENCOUNTER — Ambulatory Visit: Payer: Self-pay | Admitting: Internal Medicine

## 2024-02-25 DIAGNOSIS — G8929 Other chronic pain: Secondary | ICD-10-CM | POA: Insufficient documentation

## 2024-02-25 NOTE — Assessment & Plan Note (Signed)
 No current flare and not on DMARD. She did have typical lesions on prior imaging. Needs PT for walking at this time ordered.

## 2024-02-25 NOTE — Assessment & Plan Note (Signed)
 Refilled protonix  and this is controlled continue.

## 2024-02-25 NOTE — Assessment & Plan Note (Signed)
 Checking lipid panel and adjust as needed. Rx printed for patient.

## 2024-02-25 NOTE — Assessment & Plan Note (Signed)
 Referral to PT to help with pain and mobility.

## 2024-02-25 NOTE — Assessment & Plan Note (Signed)
 Flu shot up to date. Pneumonia 20 given. Shingrix due at pharmacy. Tetanus due at pharmacy. Colonoscopy counseled. Mammogram counseled, pap smear aged out and dexa complete. Counseled about sun safety and mole surveillance. Counseled about the dangers of distracted driving. Given 10 year screening recommendations.
